# Patient Record
Sex: Male | Born: 1990 | Hispanic: Yes | Marital: Single | Smoking: Never smoker
Health system: Southern US, Community
[De-identification: ages and names within clinical notes are randomized; demographics above are authoritative.]

---

## 2019-10-08 ENCOUNTER — Other Ambulatory Visit: Payer: Self-pay | Admitting: *Deleted

## 2019-10-08 DIAGNOSIS — Z20822 Contact with and (suspected) exposure to covid-19: Secondary | ICD-10-CM

## 2019-10-10 LAB — NOVEL CORONAVIRUS, NAA: SARS-CoV-2, NAA: DETECTED — AB

## 2019-10-11 ENCOUNTER — Ambulatory Visit (INDEPENDENT_AMBULATORY_CARE_PROVIDER_SITE_OTHER)
Admission: RE | Admit: 2019-10-11 | Discharge: 2019-10-11 | Disposition: A | Discharge status: Home / Self Care | Payer: HRSA Program | Source: Ambulatory Visit

## 2019-10-11 ENCOUNTER — Telehealth: Payer: Self-pay

## 2019-10-11 DIAGNOSIS — R05 Cough: Secondary | ICD-10-CM | POA: Diagnosis not present

## 2019-10-11 DIAGNOSIS — U071 COVID-19: Secondary | ICD-10-CM

## 2019-10-11 MED ORDER — BENZONATATE 100 MG PO CAPS: 100.0000 mg | ORAL_CAPSULE | Freq: Three times a day (TID) | ORAL | 0 refills | Status: DC

## 2019-10-11 NOTE — Discharge Instructions (Signed)
Push fluids to ensure adequate hydration and keep secretions thin.  Aleve 500mg  twice a day, tylenol 1000mg  every 8 hours.  Please check to see if in your day/night syrup there is no additional acetaminophen- this is tylenol- so you are not taking too much tylenol.  Rest.  You may try Tessalon, which I have sent to the pharmacy, to try to help with the cough.  Over the counter Mucinex-D may also be helpful.  This likely will improve in the next week.  If you develop shortness of breath or difficulty breathing please be seen in person.

## 2019-10-11 NOTE — ED Provider Notes (Signed)
Virtual Visit via Video Note:  Albert Hammond  initiated request for Telemedicine visit with [facility] team. I connected with Albert Hammond  on 10/11/2019 at 5:40 PM  for a synchronized telemedicine visit using a video enabled HIPPA compliant telemedicine application. I verified that I am speaking with Albert Hammond  using two identifiers. Albert Gottron, NP  was physically located in a [facility]gent care site and Jacobo Breeze Angell was located at a different location.   The limitations of evaluation and management by telemedicine as well as the availability of in-person appointments were discussed. Patient was informed that he  may incur a bill ( including co-pay) for this virtual visit encounter. Albert Hammond  expressed understanding and gave verbal consent to proceed with virtual visit.     History of Present Illness:Albert Hammond  is a 28 y.o. male presents with complaints of fever, chills, cough, headache. Symptoms started approximately 6 days ago. Tested positive for covid-19 on 12/9. Discomfort with deep breathing, which triggers cough. Otherwise no shortness of breath. Has been taking aleve, tylenol, day/nigh cold/flu medications which haven't helped. Hasn't taken tylenol in two days ago. Took aleve last this morning. Requesting recommendations for symptom management for cough and headache related to covid-19.   History reviewed. No pertinent past medical history.  Not on File      Observations/Objective: Alert, oriented, non toxic in appearance. Clear coherent speech without difficulty. No increased work of breathing visualized.  Occasional dry cough noted. Patient in mask.   Assessment and Plan: Known covid-19 infection. Supportive cares recommended. Medication regimens discussed. Tessalon provided. In person precautions discussed. Patient verbalized understanding and agreeable to plan.    Follow Up  Instructions:    I discussed the assessment and treatment plan with the patient. The patient was provided an opportunity to ask questions and all were answered. The patient agreed with the plan and demonstrated an understanding of the instructions.   The patient was advised to call back or seek an in-person evaluation if the symptoms worsen or if the condition fails to improve as anticipated.  I provided 20 minutes of non-face-to-face time during this encounter.    Albert Gottron, NP  10/11/2019 5:40 PM          Albert Gottron, NP 10/11/19 1740

## 2019-10-13 ENCOUNTER — Inpatient Hospital Stay (HOSPITAL_COMMUNITY)
Admission: AD | Admit: 2019-10-13 | Discharge: 2019-10-22 | DRG: 177 | Disposition: A | Discharge status: Home / Self Care | Payer: HRSA Program | Source: Other Acute Inpatient Hospital | Attending: Internal Medicine | Admitting: Internal Medicine

## 2019-10-13 ENCOUNTER — Inpatient Hospital Stay: Payer: HRSA Program

## 2019-10-13 ENCOUNTER — Emergency Department: Payer: HRSA Program

## 2019-10-13 ENCOUNTER — Other Ambulatory Visit: Payer: Self-pay

## 2019-10-13 ENCOUNTER — Ambulatory Visit
Admission: EM | Admit: 2019-10-13 | Discharge: 2019-10-13 | Disposition: A | Discharge status: Home / Self Care | Payer: HRSA Program

## 2019-10-13 ENCOUNTER — Encounter: Payer: Self-pay | Admitting: Emergency Medicine

## 2019-10-13 ENCOUNTER — Ambulatory Visit (INDEPENDENT_AMBULATORY_CARE_PROVIDER_SITE_OTHER)
Admission: RE | Admit: 2019-10-13 | Discharge: 2019-10-13 | Disposition: A | Discharge status: Home / Self Care | Payer: Self-pay | Source: Ambulatory Visit

## 2019-10-13 ENCOUNTER — Inpatient Hospital Stay
Admission: EM | Admit: 2019-10-13 | Discharge: 2019-10-13 | DRG: 177 | Disposition: A | Discharge status: Other Acute Inpatient Hospital | Payer: HRSA Program | Source: Ambulatory Visit | Attending: Internal Medicine | Admitting: Internal Medicine

## 2019-10-13 DIAGNOSIS — E872 Acidosis: Secondary | ICD-10-CM | POA: Diagnosis present

## 2019-10-13 DIAGNOSIS — Z833 Family history of diabetes mellitus: Secondary | ICD-10-CM | POA: Diagnosis not present

## 2019-10-13 DIAGNOSIS — J1282 Pneumonia due to coronavirus disease 2019: Secondary | ICD-10-CM | POA: Diagnosis present

## 2019-10-13 DIAGNOSIS — J9601 Acute respiratory failure with hypoxia: Secondary | ICD-10-CM | POA: Diagnosis present

## 2019-10-13 DIAGNOSIS — I82442 Acute embolism and thrombosis of left tibial vein: Secondary | ICD-10-CM | POA: Diagnosis present

## 2019-10-13 DIAGNOSIS — U071 COVID-19: Secondary | ICD-10-CM

## 2019-10-13 DIAGNOSIS — Z6841 Body Mass Index (BMI) 40.0 and over, adult: Secondary | ICD-10-CM | POA: Diagnosis not present

## 2019-10-13 DIAGNOSIS — Z79899 Other long term (current) drug therapy: Secondary | ICD-10-CM

## 2019-10-13 DIAGNOSIS — I82452 Acute embolism and thrombosis of left peroneal vein: Secondary | ICD-10-CM | POA: Diagnosis present

## 2019-10-13 DIAGNOSIS — R7989 Other specified abnormal findings of blood chemistry: Secondary | ICD-10-CM | POA: Diagnosis not present

## 2019-10-13 DIAGNOSIS — R7401 Elevation of levels of liver transaminase levels: Secondary | ICD-10-CM | POA: Diagnosis present

## 2019-10-13 DIAGNOSIS — R0902 Hypoxemia: Secondary | ICD-10-CM | POA: Diagnosis not present

## 2019-10-13 DIAGNOSIS — R079 Chest pain, unspecified: Secondary | ICD-10-CM

## 2019-10-13 DIAGNOSIS — E1165 Type 2 diabetes mellitus with hyperglycemia: Secondary | ICD-10-CM | POA: Diagnosis present

## 2019-10-13 DIAGNOSIS — J1289 Other viral pneumonia: Secondary | ICD-10-CM | POA: Diagnosis present

## 2019-10-13 DIAGNOSIS — R0603 Acute respiratory distress: Secondary | ICD-10-CM

## 2019-10-13 DIAGNOSIS — R739 Hyperglycemia, unspecified: Secondary | ICD-10-CM | POA: Diagnosis present

## 2019-10-13 DIAGNOSIS — R0602 Shortness of breath: Secondary | ICD-10-CM

## 2019-10-13 LAB — COMPREHENSIVE METABOLIC PANEL
ALT: 31 U/L (ref 0–44)
AST: 51 U/L — ABNORMAL HIGH (ref 15–41)
Albumin: 3.1 g/dL — ABNORMAL LOW (ref 3.5–5.0)
Alkaline Phosphatase: 111 U/L (ref 38–126)
Anion gap: 19 — ABNORMAL HIGH (ref 5–15)
BUN: 13 mg/dL (ref 6–20)
CO2: 20 mmol/L — ABNORMAL LOW (ref 22–32)
Calcium: 8.9 mg/dL (ref 8.9–10.3)
Chloride: 97 mmol/L — ABNORMAL LOW (ref 98–111)
Creatinine, Ser: 0.68 mg/dL (ref 0.61–1.24)
GFR calc Af Amer: >60 mL/min (ref >60)
GFR calc non Af Amer: >60 mL/min (ref >60)
Glucose, Bld: 346 mg/dL — ABNORMAL HIGH (ref 70–99)
Potassium: 4.1 mmol/L (ref 3.5–5.1)
Sodium: 136 mmol/L (ref 135–145)
Total Bilirubin: 1 mg/dL (ref 0.3–1.2)
Total Protein: 7.8 g/dL (ref 6.5–8.1)

## 2019-10-13 LAB — CBC WITH DIFFERENTIAL/PLATELET
Abs Immature Granulocytes: 0.06 10*3/uL (ref 0.00–0.07)
Basophils Absolute: 0 10*3/uL (ref 0.0–0.1)
Basophils Relative: 0 %
Eosinophils Absolute: 0 10*3/uL (ref 0.0–0.5)
Eosinophils Relative: 0 %
HCT: 41.7 % (ref 39.0–52.0)
Hemoglobin: 14.4 g/dL (ref 13.0–17.0)
Immature Granulocytes: 1 %
Lymphocytes Relative: 21 %
Lymphs Abs: 1.7 10*3/uL (ref 0.7–4.0)
MCH: 28.1 pg (ref 26.0–34.0)
MCHC: 34.5 g/dL (ref 30.0–36.0)
MCV: 81.3 fL (ref 80.0–100.0)
Monocytes Absolute: 0.3 10*3/uL (ref 0.1–1.0)
Monocytes Relative: 4 %
Neutro Abs: 5.9 10*3/uL (ref 1.7–7.7)
Neutrophils Relative %: 74 %
Platelets: 225 10*3/uL (ref 150–400)
RBC: 5.13 MIL/uL (ref 4.22–5.81)
RDW: 12.1 % (ref 11.5–15.5)
WBC: 7.9 10*3/uL (ref 4.0–10.5)
nRBC: 0 % (ref 0.0–0.2)

## 2019-10-13 LAB — ABO/RH
ABO/RH(D): O POS
ABO/RH(D): O POS

## 2019-10-13 LAB — BRAIN NATRIURETIC PEPTIDE: B Natriuretic Peptide: 43 pg/mL (ref 0.0–100.0)

## 2019-10-13 LAB — BASIC METABOLIC PANEL
Anion gap: 15 (ref 5–15)
BUN: 15 mg/dL (ref 6–20)
CO2: 20 mmol/L — ABNORMAL LOW (ref 22–32)
Calcium: 8.1 mg/dL — ABNORMAL LOW (ref 8.9–10.3)
Chloride: 102 mmol/L (ref 98–111)
Creatinine, Ser: 0.73 mg/dL (ref 0.61–1.24)
GFR calc Af Amer: >60 mL/min (ref >60)
GFR calc non Af Amer: >60 mL/min (ref >60)
Glucose, Bld: 339 mg/dL — ABNORMAL HIGH (ref 70–99)
Potassium: 4.5 mmol/L (ref 3.5–5.1)
Sodium: 137 mmol/L (ref 135–145)

## 2019-10-13 LAB — FIBRIN DERIVATIVES D-DIMER (ARMC ONLY): Fibrin derivatives D-dimer (ARMC): 2572.14 ng/mL (FEU) — ABNORMAL HIGH (ref 0.00–499.00)

## 2019-10-13 LAB — TROPONIN I (HIGH SENSITIVITY)
Troponin I (High Sensitivity): 8 ng/L (ref <18)
Troponin I (High Sensitivity): 5 ng/L (ref <18)

## 2019-10-13 LAB — LACTIC ACID, PLASMA
Lactic Acid, Venous: 1.3 mmol/L (ref 0.5–1.9)
Lactic Acid, Venous: 1.7 mmol/L (ref 0.5–1.9)

## 2019-10-13 LAB — TRIGLYCERIDES: Triglycerides: 221 mg/dL — ABNORMAL HIGH (ref <150)

## 2019-10-13 LAB — FERRITIN: Ferritin: 899 ng/mL — ABNORMAL HIGH (ref 24–336)

## 2019-10-13 LAB — LACTATE DEHYDROGENASE: LDH: 473 U/L — ABNORMAL HIGH (ref 98–192)

## 2019-10-13 LAB — FIBRINOGEN: Fibrinogen: >750 mg/dL — ABNORMAL HIGH (ref 210–475)

## 2019-10-13 LAB — PROCALCITONIN: Procalcitonin: 0.15 ng/mL

## 2019-10-13 LAB — GLUCOSE, CAPILLARY
Glucose-Capillary: 308 mg/dL — ABNORMAL HIGH (ref 70–99)
Glucose-Capillary: 267 mg/dL — ABNORMAL HIGH (ref 70–99)
Glucose-Capillary: 308 mg/dL — ABNORMAL HIGH (ref 70–99)

## 2019-10-13 LAB — C-REACTIVE PROTEIN: CRP: 47.5 mg/dL — ABNORMAL HIGH (ref <1.0)

## 2019-10-13 MED ORDER — SODIUM CHLORIDE 0.9 % IV SOLN
100.0000 mg | Freq: Every day | INTRAVENOUS | Status: DC
  Filled 2019-10-13: qty 20

## 2019-10-13 MED ORDER — DEXAMETHASONE 6 MG PO TABS
6.0000 mg | ORAL_TABLET | ORAL | Status: DC
  Administered 2019-10-13: 6 mg via ORAL
  Filled 2019-10-13: qty 1

## 2019-10-13 MED ORDER — SODIUM CHLORIDE 0.9% FLUSH
3.0000 mL | Freq: Two times a day (BID) | INTRAVENOUS | Status: DC
  Administered 2019-10-14 – 2019-10-15 (×3): 3 mL via INTRAVENOUS

## 2019-10-13 MED ORDER — DEXAMETHASONE SODIUM PHOSPHATE 10 MG/ML IJ SOLN
6.0000 mg | Freq: Once | INTRAMUSCULAR | Status: AC
  Administered 2019-10-13: 6 mg via INTRAVENOUS
  Filled 2019-10-13: qty 1

## 2019-10-13 MED ORDER — IOHEXOL 350 MG/ML SOLN
75.0000 mL | Freq: Once | INTRAVENOUS | Status: AC | PRN
  Administered 2019-10-13: 75 mL via INTRAVENOUS

## 2019-10-13 MED ORDER — VITAMIN D3 25 MCG (1000 UNIT) PO TABS: 1000.0000 [IU] | ORAL_TABLET | Freq: Every day | ORAL | Status: AC

## 2019-10-13 MED ORDER — ENOXAPARIN SODIUM 40 MG/0.4ML ~~LOC~~ SOLN: 40.0000 mg | SUBCUTANEOUS | Status: DC

## 2019-10-13 MED ORDER — IPRATROPIUM-ALBUTEROL 20-100 MCG/ACT IN AERS
1.0000 | INHALATION_SPRAY | Freq: Four times a day (QID) | RESPIRATORY_TRACT | Status: DC
  Filled 2019-10-13: qty 4

## 2019-10-13 MED ORDER — SODIUM CHLORIDE 0.9 % IV SOLN
200.0000 mg | Freq: Once | INTRAVENOUS | Status: DC
  Filled 2019-10-13: qty 40

## 2019-10-13 MED ORDER — ASCORBIC ACID 500 MG PO TABS
500.0000 mg | ORAL_TABLET | Freq: Every day | ORAL | Status: DC
  Administered 2019-10-14 – 2019-10-22 (×9): 500 mg via ORAL
  Filled 2019-10-13 (×9): qty 1

## 2019-10-13 MED ORDER — DEXAMETHASONE SODIUM PHOSPHATE 10 MG/ML IJ SOLN: 6.0000 mg | INTRAMUSCULAR | 0 refills | Status: DC

## 2019-10-13 MED ORDER — ONDANSETRON HCL 4 MG PO TABS: 4.0000 mg | ORAL_TABLET | Freq: Four times a day (QID) | ORAL | 0 refills | Status: AC | PRN

## 2019-10-13 MED ORDER — SODIUM CHLORIDE 0.9 % IV SOLN
INTRAVENOUS | Status: DC
  Administered 2019-10-13: 18:00:00 via INTRAVENOUS

## 2019-10-13 MED ORDER — INSULIN ASPART 100 UNIT/ML ~~LOC~~ SOLN: 0.0000 [IU] | SUBCUTANEOUS | 11 refills | Status: DC

## 2019-10-13 MED ORDER — FAMOTIDINE 20 MG PO TABS: 20.0000 mg | ORAL_TABLET | Freq: Two times a day (BID) | ORAL | Status: DC

## 2019-10-13 MED ORDER — SODIUM CHLORIDE 0.9 % IV SOLN
100.0000 mg | Freq: Every day | INTRAVENOUS | Status: AC
  Administered 2019-10-14 – 2019-10-17 (×4): 100 mg via INTRAVENOUS
  Filled 2019-10-13 (×4): qty 20

## 2019-10-13 MED ORDER — ONDANSETRON HCL 4 MG PO TABS: 4.0000 mg | ORAL_TABLET | Freq: Four times a day (QID) | ORAL | Status: DC | PRN

## 2019-10-13 MED ORDER — ASCORBIC ACID 500 MG PO TABS
500.0000 mg | ORAL_TABLET | Freq: Every day | ORAL | Status: DC
  Filled 2019-10-13 (×2): qty 1

## 2019-10-13 MED ORDER — SODIUM CHLORIDE 0.9 % IV SOLN: 200.0000 mg | Freq: Once | INTRAVENOUS | Status: DC

## 2019-10-13 MED ORDER — ASCORBIC ACID 500 MG PO TABS: 500.0000 mg | ORAL_TABLET | Freq: Every day | ORAL | Status: AC

## 2019-10-13 MED ORDER — SODIUM CHLORIDE 0.9 % IV SOLN
200.0000 mg | Freq: Once | INTRAVENOUS | Status: AC
  Administered 2019-10-13: 200 mg via INTRAVENOUS
  Filled 2019-10-13: qty 200

## 2019-10-13 MED ORDER — INSULIN ASPART 100 UNIT/ML ~~LOC~~ SOLN: 0.0000 [IU] | SUBCUTANEOUS | Status: DC

## 2019-10-13 MED ORDER — ONDANSETRON HCL 4 MG/2ML IJ SOLN: 4.0000 mg | Freq: Four times a day (QID) | INTRAMUSCULAR | Status: DC | PRN

## 2019-10-13 MED ORDER — SODIUM CHLORIDE 0.9 % IV SOLN: 250.0000 mL | INTRAVENOUS | Status: DC | PRN

## 2019-10-13 MED ORDER — VITAMIN D3 25 MCG (1000 UNIT) PO TABS
1000.0000 [IU] | ORAL_TABLET | Freq: Every day | ORAL | Status: DC
  Filled 2019-10-13: qty 1

## 2019-10-13 MED ORDER — INSULIN ASPART 100 UNIT/ML ~~LOC~~ SOLN
0.0000 [IU] | Freq: Three times a day (TID) | SUBCUTANEOUS | Status: DC
  Administered 2019-10-14: 6 [IU] via SUBCUTANEOUS
  Administered 2019-10-14: 4 [IU] via SUBCUTANEOUS
  Administered 2019-10-14: 09:00:00 3 [IU] via SUBCUTANEOUS
  Administered 2019-10-15: 5 [IU] via SUBCUTANEOUS
  Administered 2019-10-15: 12:00:00 6 [IU] via SUBCUTANEOUS

## 2019-10-13 MED ORDER — ONDANSETRON HCL 4 MG/2ML IJ SOLN: 4.0000 mg | Freq: Four times a day (QID) | INTRAMUSCULAR | 0 refills | Status: DC | PRN

## 2019-10-13 MED ORDER — DEXAMETHASONE SODIUM PHOSPHATE 10 MG/ML IJ SOLN: 6.0000 mg | INTRAMUSCULAR | Status: DC

## 2019-10-13 MED ORDER — SODIUM CHLORIDE 0.9 % IV SOLN: 100.0000 mg | Freq: Every day | INTRAVENOUS | Status: DC

## 2019-10-13 MED ORDER — ENOXAPARIN SODIUM 60 MG/0.6ML ~~LOC~~ SOLN
0.5000 mg/kg | Freq: Two times a day (BID) | SUBCUTANEOUS | Status: DC
  Administered 2019-10-13 – 2019-10-17 (×9): 45 mg via SUBCUTANEOUS
  Filled 2019-10-13 (×9): qty 0.6

## 2019-10-13 MED ORDER — ZINC SULFATE 220 (50 ZN) MG PO CAPS: 220.0000 mg | ORAL_CAPSULE | Freq: Every day | ORAL | Status: AC

## 2019-10-13 MED ORDER — SODIUM CHLORIDE 0.9 % IV BOLUS
1000.0000 mL | Freq: Once | INTRAVENOUS | Status: AC
  Administered 2019-10-13: 1000 mL via INTRAVENOUS

## 2019-10-13 MED ORDER — INSULIN GLARGINE 100 UNIT/ML ~~LOC~~ SOLN
10.0000 [IU] | Freq: Every day | SUBCUTANEOUS | Status: DC
  Administered 2019-10-13: 10 [IU] via SUBCUTANEOUS
  Filled 2019-10-13 (×2): qty 0.1

## 2019-10-13 MED ORDER — IPRATROPIUM-ALBUTEROL 20-100 MCG/ACT IN AERS: 1.0000 | INHALATION_SPRAY | Freq: Four times a day (QID) | RESPIRATORY_TRACT | Status: DC

## 2019-10-13 MED ORDER — SODIUM CHLORIDE 0.9% FLUSH: 3.0000 mL | INTRAVENOUS | Status: DC | PRN

## 2019-10-13 MED ORDER — SODIUM CHLORIDE 0.9 % IV SOLN: 2000.0000 mL | INTRAVENOUS | 0 refills | Status: DC

## 2019-10-13 MED ORDER — ZINC SULFATE 220 (50 ZN) MG PO CAPS
220.0000 mg | ORAL_CAPSULE | Freq: Every day | ORAL | Status: DC
  Filled 2019-10-13: qty 1

## 2019-10-13 MED ORDER — ACETAMINOPHEN 325 MG PO TABS: 650.0000 mg | ORAL_TABLET | Freq: Four times a day (QID) | ORAL | Status: DC | PRN

## 2019-10-13 MED ORDER — ZINC SULFATE 220 (50 ZN) MG PO CAPS
220.0000 mg | ORAL_CAPSULE | Freq: Every day | ORAL | Status: DC
  Administered 2019-10-14 – 2019-10-22 (×9): 220 mg via ORAL
  Filled 2019-10-13 (×9): qty 1

## 2019-10-13 MED ORDER — INSULIN ASPART 100 UNIT/ML ~~LOC~~ SOLN
15.0000 [IU] | Freq: Once | SUBCUTANEOUS | Status: AC
  Administered 2019-10-13: 15 [IU] via SUBCUTANEOUS
  Filled 2019-10-13: qty 1

## 2019-10-13 MED ORDER — ACETAMINOPHEN 325 MG PO TABS
650.0000 mg | ORAL_TABLET | Freq: Four times a day (QID) | ORAL | Status: DC | PRN
  Administered 2019-10-18: 650 mg via ORAL
  Filled 2019-10-13 (×2): qty 2

## 2019-10-13 NOTE — ED Triage Notes (Signed)
Patient from UC via [facility]. Reports worsening SOB and cough since Wednesday. Reports this morning he felt like he couldn't catch his breath at all.   Patient and family members tested positive for covid 5 days ago.  Patient 60% on RA for UC. O2 increased to 85% when placed on non-rebreather at [facility]. Upon arrival to ED, patient is on 4L [place] with Ow2 saturations of 73%. Non-rebreather placed on patient with 15L, O2 improved to 88%.

## 2019-10-13 NOTE — ED Provider Notes (Signed)
[facility] Emergency Department Provider Note  ____________________________________________   First MD Initiated Contact with Patient 10/13/19 1458     (approximate)  I have reviewed the triage vital signs and the nursing notes.   HISTORY  Chief Complaint Shortness of Breath    HPI Albert Hammond is a 28 y.o. male with known coronavirus she comes in with shortness of breath.  Patient has had symptoms for 1 week.  He was positive for coronavirus 5 days ago.  Patient satting 57 to 64% on room air at urgent care.  Patient states his symptoms for started 1 week ago however he was tested positive on Wednesday.  He states over the past 3 days has had worsening shortness of breath is severe, constant, worse with exertion, not take anything at home.  He is also had some fevers and body aches as well.  Denies any history of blood clots.  Otherwise healthy.  No unilateral leg swelling.  No other risk factors for pulmonary embolism          History reviewed. No pertinent past medical history.  There are no problems to display for this patient.   History reviewed. No pertinent surgical history.  Prior to Admission medications   Medication Sig Start Date End Date Taking? Authorizing Provider  benzonatate (TESSALON) 100 MG capsule Take 1-2 capsules (100-200 mg total) by mouth every 8 (eight) hours. 10/11/19   Georgetta Haber, NP    Allergies Patient has no known allergies.  No family history on file.  Social History Social History   Tobacco Use  . Smoking status: Never Smoker  . Smokeless tobacco: Never Used  Substance Use Topics  . Alcohol use: Yes    Comment: occasional  . Drug use: Never      Review of Systems Constitutional: Positive fevers, body aches Eyes: No visual changes. ENT: No sore throat. Cardiovascular: No chest pain Respiratory: Positive for SOB, cough Gastrointestinal: No abdominal pain.  No nausea, no vomiting.  No  diarrhea.  No constipation. Genitourinary: Negative for dysuria. Musculoskeletal: Negative for back pain. Skin: Negative for rash. Neurological: Positive headache, no focal weakness or numbness. All other ROS negative ____________________________________________   PHYSICAL EXAM:  VITAL SIGNS: ED Triage Vitals  Enc Vitals Group     BP 10/13/19 1432 115/63     Pulse Rate 10/13/19 1432 (!) 117     Resp 10/13/19 1432 (!) 28     Temp 10/13/19 1432 98.4 F (36.9 C)     Temp Source 10/13/19 1432 Oral     SpO2 10/13/19 1432 (!) 88 %     Weight 10/13/19 1433 200 lb (90.7 kg)     Height 10/13/19 1433 5\' 7"  (1.702 m)     Head Circumference --      Peak Flow --      Pain Score 10/13/19 1433 0     Pain Loc --      Pain Edu? --      Excl. in GC? --     Constitutional: Alert and oriented. Well appearing and in no acute distress. Eyes: Conjunctivae are normal. EOMI. Head: Atraumatic. Nose: No congestion/rhinnorhea. Mouth/Throat: Mucous membranes are moist.   Neck: No stridor. Trachea Midline. FROM Cardiovascular: Normal rate, regular rhythm. Grossly normal heart sounds.  Good peripheral circulation. Respiratory: Mild increased work of breathing, on nonrebreathing, no stridor, speaking in full sentences Gastrointestinal: Soft and nontender. No distention. No abdominal bruits.  Musculoskeletal: No lower extremity tenderness nor  edema.  No joint effusions. Neurologic:  Normal speech and language. No gross focal neurologic deficits are appreciated.  Skin:  Skin is warm, dry and intact. No rash noted. Psychiatric: Mood and affect are normal. Speech and behavior are normal. GU: Deferred   ____________________________________________   LABS (all labs ordered are listed, but only abnormal results are displayed)  Labs Reviewed  CULTURE, BLOOD (ROUTINE X 2)  CULTURE, BLOOD (ROUTINE X 2)  LACTIC ACID, PLASMA  LACTIC ACID, PLASMA  CBC WITH DIFFERENTIAL/PLATELET  COMPREHENSIVE METABOLIC  PANEL  FIBRIN DERIVATIVES D-DIMER ([facility] ONLY)  PROCALCITONIN  LACTATE DEHYDROGENASE  FERRITIN  TRIGLYCERIDES  FIBRINOGEN  C-REACTIVE PROTEIN  BRAIN NATRIURETIC PEPTIDE  TROPONIN I (HIGH SENSITIVITY)   ____________________________________________   ED ECG REPORT I, Vanessa St. Florian, the attending physician, personally viewed and interpreted this ECG.  EKG sinus tachycardia rate of 112, no ST elevation, T wave inversion in lead III, normal intervals ____________________________________________  RADIOLOGY Robert Bellow, personally viewed and evaluated these images (plain radiographs) as part of my medical decision making, as well as reviewing the written report by the radiologist.  ED MD interpretation: Bilateral opacities consistent with Covid  Official radiology report(s): CT ANGIO CHEST PE W OR WO CONTRAST  Result Date: 10/13/2019 CLINICAL DATA:  Covid +, hypoxia^66mL OMNIPAQUE IOHEXOL 350 MG/ML SOLNHypoxemia EXAM: CT ANGIOGRAPHY CHEST WITH CONTRAST TECHNIQUE: Multidetector CT imaging of the chest was performed using the standard protocol during bolus administration of intravenous contrast. Multiplanar CT image reconstructions and MIPs were obtained to evaluate the vascular anatomy. CONTRAST:  62mL OMNIPAQUE IOHEXOL 350 MG/ML SOLN COMPARISON:  Radiograph same day FINDINGS: Cardiovascular: No filling defects within the pulmonary arteries to suggest acute pulmonary embolism. No acute findings of the aorta or great vessels. No pericardial fluid. Mediastinum/Nodes: No axillary or supraclavicular adenopathy. No mediastinal hilar adenopathy. No pericardial fluid Lungs/Pleura: multinodular airspace disease the upper lobes surrounded by ground-glass opacities. Dense consolidation in the RIGHT lower lobe. Medial consolidation LEFT lower lobe. Upper Abdomen: Limited view of the liver, kidneys, pancreas are unremarkable. Normal adrenal glands. Musculoskeletal: No aggressive osseous lesion. Review of  the MIP images confirms the above findings. IMPRESSION: 1. No evidence acute pulmonary embolism. 2. Severe bilateral multifocal pneumonia. Upper lobe nodular airspace disease with peripheral gland glass opacities. Dense consolidation in the lower lobes, greater on the RIGHT. Electronically Signed   By: Suzy Bouchard M.D.   On: 10/13/2019 17:19   DG Chest Port 1 View  Result Date: 10/13/2019 CLINICAL DATA:  Shortness of breath, cough EXAM: PORTABLE CHEST 1 VIEW COMPARISON:  None. FINDINGS: Patchy bilateral airspace opacities. Heart is normal size. Low lung volumes. No effusions or pneumothorax. No acute bony abnormality. IMPRESSION: Patchy bilateral airspace disease compatible with multifocal pneumonia. Electronically Signed   By: Rolm Baptise M.D.   On: 10/13/2019 15:19    ____________________________________________   PROCEDURES  Procedure(s) performed (including Critical Care):  .Critical Care Performed by: Vanessa Tyler, MD Authorized by: Vanessa Wildomar, MD   Critical care provider statement:    Critical care time (minutes):  35   Critical care was necessary to treat or prevent imminent or life-threatening deterioration of the following conditions:  Respiratory failure   Critical care was time spent personally by me on the following activities:  Discussions with consultants, evaluation of patient's response to treatment, examination of patient, ordering and performing treatments and interventions, ordering and review of laboratory studies, ordering and review of radiographic studies, pulse oximetry, re-evaluation of patient's condition,  obtaining history from patient or surrogate and review of old charts     ____________________________________________   INITIAL IMPRESSION / ASSESSMENT AND PLAN / ED COURSE   Jodi Larrie KassRodriguez Umansor was evaluated in Emergency Department on 10/13/2019 for the symptoms described in the history of present illness. He was evaluated in the context of  the global COVID-19 pandemic, which necessitated consideration that the patient might be at risk for infection with the SARS-CoV-2 virus that causes COVID-19. Institutional protocols and algorithms that pertain to the evaluation of patients at risk for COVID-19 are in a state of rapid change based on information released by regulatory bodies including the CDC and federal and state organizations. These policies and algorithms were followed during the patient's care in the ED.     Pt presents with SOB.  This is most likely secondary to coronavirus PNA-will get xray to evaluation Anemia-CBC to evaluate ACS- will get trops Arrhythmia-Will get EKG and keep on monitor.  COVID- will get testing per algorithm. PE-lower suspicion given no risk factors and other cause more likely from coronavirus.  Called respiratory to place patient on high flow nasal cannula given he satting 88% on nonrebreather.   Count is normal no fever will hold off antibiotics at this time.  Procalcitonin less than 0.25 as well.  D/w hospital team for admission for Covid          Clinical Course as of Oct 12 1742  Mon Oct 13, 2019  1603 DG Chest CrawfordPort 1 View [MF]    Clinical Course User Index [MF] Concha SeFunke, Breuna Loveall E, MD     ____________________________________________   FINAL CLINICAL IMPRESSION(S) / ED DIAGNOSES   Final diagnoses:  COVID-19  Acute respiratory failure with hypoxia (HCC)     MEDICATIONS GIVEN DURING THIS VISIT:  Medications  enoxaparin (LOVENOX) injection 40 mg (has no administration in time range)  0.9 %  sodium chloride infusion ( Intravenous New Bag/Given 10/13/19 1743)  dexamethasone (DECADRON) injection 6 mg (has no administration in time range)  famotidine (PEPCID) tablet 20 mg (has no administration in time range)  acetaminophen (TYLENOL) tablet 650 mg (has no administration in time range)  ascorbic acid (VITAMIN C) tablet 500 mg (has no administration in time range)  zinc sulfate  capsule 220 mg (has no administration in time range)  remdesivir 200 mg in sodium chloride 0.9% 250 mL IVPB (has no administration in time range)    Followed by  remdesivir 100 mg in sodium chloride 0.9 % 100 mL IVPB (has no administration in time range)  ondansetron (ZOFRAN) tablet 4 mg (has no administration in time range)    Or  ondansetron (ZOFRAN) injection 4 mg (has no administration in time range)  insulin aspart (novoLOG) injection 0-20 Units (has no administration in time range)  dexamethasone (DECADRON) injection 6 mg (6 mg Intravenous Given 10/13/19 1548)  iohexol (OMNIPAQUE) 350 MG/ML injection 75 mL (75 mLs Intravenous Contrast Given 10/13/19 1705)  insulin aspart (novoLOG) injection 15 Units (15 Units Subcutaneous Given 10/13/19 1738)  sodium chloride 0.9 % bolus 1,000 mL (1,000 mLs Intravenous New Bag/Given 10/13/19 1743)     ED Discharge Orders    None       Note:  This document was prepared using Dragon voice recognition software and may include unintentional dictation errors.   Concha SeFunke, Conchetta Lamia E, MD 10/13/19 [phone] [id number]

## 2019-10-13 NOTE — ED Notes (Signed)
Pt transferred to ER.

## 2019-10-13 NOTE — H&P (Signed)
History and Physical    Albert Hammond UXN:[id number] DOB: 1990/11/09 DOA: 10/13/2019  PCP: Patient, No Pcp Per  Patient coming from:  home  Chief Complaint:  sob  HPI: Albert Hammond is a 28 y.o. male with no previous past medical history comes emergency department with worsening shortness of breath and cough tested positive for Covid 5 days ago.  Been having fevers chills or shortness of breath for about 10 days.  Denies any nausea vomiting diarrhea.  He has no past medical history and is does not have any history of diabetes that he knows of.  Patient found to be hypoxic in the emergency department with oxygen sats in the 60s on room air.  He has been placed on high flow nasal cannula at 50 L and given remdesivir and Decadron and referred to admission to [facility] in [place].  Patient was transferred here for care of his bilateral Covid pneumonia.   Review of Systems: As per HPI otherwise 10 point review of systems negative.   No past medical history on file. none   No past surgical history on file.  none   reports that he has never smoked. He has never used smokeless tobacco. He reports current alcohol use. He reports that he does not use drugs.  No Known Allergies  No family history on file. no premature coronary artery disease  Prior to Admission medications   Medication Sig Start Date End Date Taking? Authorizing Provider  acetaminophen (TYLENOL) 325 MG tablet Take 650 mg by mouth every 6 (six) hours as needed.    [provider]  ascorbic acid (VITAMIN C) 500 MG tablet Take 1 tablet (500 mg total) by mouth daily. 10/13/19   Lorella Nimrod, MD  cholecalciferol (VITAMIN D) 25 MCG (1000 UT) tablet Take 1 tablet (1,000 Units total) by mouth daily. 10/13/19   Lorella Nimrod, MD  dexamethasone (DECADRON) 10 MG/ML injection Inject 0.6 mLs (6 mg total) into the vein daily. 10/14/19   Lorella Nimrod, MD  enoxaparin (LOVENOX) 40 MG/0.4ML injection Inject  0.4 mLs (40 mg total) into the skin daily. 10/13/19   Lorella Nimrod, MD  famotidine (PEPCID) 20 MG tablet Take 1 tablet (20 mg total) by mouth 2 (two) times daily. 10/13/19   Lorella Nimrod, MD  insulin aspart (NOVOLOG) 100 UNIT/ML injection Inject 0-20 Units into the skin every 4 (four) hours. 10/13/19   Lorella Nimrod, MD  Ipratropium-Albuterol (COMBIVENT) 20-100 MCG/ACT AERS respimat Inhale 1 puff into the lungs every 6 (six) hours. 10/13/19   Lorella Nimrod, MD  ondansetron (ZOFRAN) 4 MG tablet Take 1 tablet (4 mg total) by mouth every 6 (six) hours as needed for nausea. 10/13/19   Lorella Nimrod, MD  ondansetron (ZOFRAN) 4 MG/2ML SOLN injection Inject 2 mLs (4 mg total) into the vein every 6 (six) hours as needed for nausea. 10/13/19   Lorella Nimrod, MD  sodium chloride 0.9 % infusion Inject 2,000 mLs into the vein continuous for 1 day. 10/13/19 10/14/19  Lorella Nimrod, MD  zinc sulfate 220 (50 Zn) MG capsule Take 1 capsule (220 mg total) by mouth daily. 10/13/19   Lorella Nimrod, MD    Physical Exam: There were no vitals filed for this visit.  On high flow nasal cannula at this time  Afebrile vital signs stable  Constitutional: NAD, calm, comfortable There were no vitals filed for this visit. Eyes: PERRL, lids and conjunctivae normal ENMT: Mucous membranes are moist. Posterior pharynx clear of any exudate or lesions.Normal  dentition.  Neck: normal, supple, no masses, no thyromegaly Respiratory: clear to auscultation bilaterally, no wheezing, no crackles. Normal respiratory effort. No accessory muscle use.  Cardiovascular: Regular rate and rhythm, no murmurs / rubs / gallops. No extremity edema. 2+ pedal pulses. No carotid bruits.  Abdomen: no tenderness, no masses palpated. No hepatosplenomegaly. Bowel sounds positive.  Musculoskeletal: no clubbing / cyanosis. No joint deformity upper and lower extremities. Good ROM, no contractures. Normal muscle tone.  Skin: no rashes, lesions, ulcers. No  induration Neurologic: CN 2-12 grossly intact. Sensation intact, DTR normal. Strength 5/5 in all 4.  Psychiatric: Normal judgment and insight. Alert and oriented x 3. Normal mood.    Labs on Admission: I have personally reviewed following labs and imaging studies  CBC: Recent Labs  Lab 10/13/19 1459  WBC 7.9  NEUTROABS 5.9  HGB 14.4  HCT 41.7  MCV 81.3  PLT 225   Basic Metabolic Panel: Recent Labs  Lab 10/13/19 1459 10/13/19 2150  NA 136 137  K 4.1 4.5  CL 97* 102  CO2 20* 20*  GLUCOSE 346* 339*  BUN 13 15  CREATININE 0.68 0.73  CALCIUM 8.9 8.1*   GFR: Estimated Creatinine Clearance: 147.6 mL/min (by C-G formula based on SCr of 0.73 mg/dL). Liver Function Tests: Recent Labs  Lab 10/13/19 1459  AST 51*  ALT 31  ALKPHOS 111  BILITOT 1.0  PROT 7.8  ALBUMIN 3.1*   No results for input(s): LIPASE, AMYLASE in the last 168 hours. No results for input(s): AMMONIA in the last 168 hours. Coagulation Profile: No results for input(s): INR, PROTIME in the last 168 hours. Cardiac Enzymes: No results for input(s): CKTOTAL, CKMB, CKMBINDEX, TROPONINI in the last 168 hours. BNP (last 3 results) No results for input(s): PROBNP in the last 8760 hours. HbA1C: No results for input(s): HGBA1C in the last 72 hours. CBG: Recent Labs  Lab 10/13/19 1728 10/13/19 1856 10/13/19 2212  GLUCAP 308* 267* 308*   Lipid Profile: Recent Labs    10/13/19 1459  TRIG 221*   Thyroid Function Tests: No results for input(s): TSH, T4TOTAL, FREET4, T3FREE, THYROIDAB in the last 72 hours. Anemia Panel: Recent Labs    10/13/19 1459  FERRITIN 899*   Urine analysis: No results found for: COLORURINE, APPEARANCEUR, LABSPEC, PHURINE, GLUCOSEU, HGBUR, BILIRUBINUR, KETONESUR, PROTEINUR, UROBILINOGEN, NITRITE, LEUKOCYTESUR Sepsis Labs: !!!!!!!!!!!!!!!!!!!!!!!!!!!!!!!!!!!!!!!!!!!! @LABRCNTIP (procalcitonin:4,lacticidven:4) ) Recent Results (from the past 240 hour(s))  Novel Coronavirus,  NAA ([facility])     Status: Abnormal   Collection Time: 10/08/19 12:00 AM   Specimen: Oropharyngeal(OP) collection in vial transport medium   OROPHARYNGEA  TESTING  Result Value Ref Range Status   SARS-CoV-2, NAA Detected (A) Not Detected Final    Comment: This nucleic acid amplification test was developed and its performance characteristics determined by 14/09/20. Nucleic acid amplification tests include PCR and TMA. This test has not been FDA cleared or approved. This test has been authorized by FDA under an Emergency Use Authorization (EUA). This test is only authorized for the duration of time the declaration that circumstances exist justifying the authorization of the emergency use of in vitro diagnostic tests for detection of SARS-CoV-2 virus and/or diagnosis of COVID-19 infection under section 564(b)(1) of the Act, 21 U.S.C. [employer]) (1), unless the authorization is terminated or revoked sooner. When diagnostic testing is negative, the possibility of a false negative result should be considered in the context of a patient's recent exposures and the presence of clinical signs and symptoms consistent with COVID-19. An  individual without symptoms of COVID-19 and who is not shedding SARS-CoV-2 virus would  expect to have a negative (not detected) result in this assay.      Radiological Exams on Admission: CT ANGIO CHEST PE W OR WO CONTRAST  Result Date: 10/13/2019 CLINICAL DATA:  Covid +, hypoxia^4275mL OMNIPAQUE IOHEXOL 350 MG/ML SOLNHypoxemia EXAM: CT ANGIOGRAPHY CHEST WITH CONTRAST TECHNIQUE: Multidetector CT imaging of the chest was performed using the standard protocol during bolus administration of intravenous contrast. Multiplanar CT image reconstructions and MIPs were obtained to evaluate the vascular anatomy. CONTRAST:  75mL OMNIPAQUE IOHEXOL 350 MG/ML SOLN COMPARISON:  Radiograph same day FINDINGS: Cardiovascular: No filling defects within the pulmonary arteries  to suggest acute pulmonary embolism. No acute findings of the aorta or great vessels. No pericardial fluid. Mediastinum/Nodes: No axillary or supraclavicular adenopathy. No mediastinal hilar adenopathy. No pericardial fluid Lungs/Pleura: multinodular airspace disease the upper lobes surrounded by ground-glass opacities. Dense consolidation in the RIGHT lower lobe. Medial consolidation LEFT lower lobe. Upper Abdomen: Limited view of the liver, kidneys, pancreas are unremarkable. Normal adrenal glands. Musculoskeletal: No aggressive osseous lesion. Review of the MIP images confirms the above findings. IMPRESSION: 1. No evidence acute pulmonary embolism. 2. Severe bilateral multifocal pneumonia. Upper lobe nodular airspace disease with peripheral gland glass opacities. Dense consolidation in the lower lobes, greater on the RIGHT. Electronically Signed   By: Genevive BiStewart  Edmunds M.D.   On: 10/13/2019 17:19   DG Chest Port 1 View  Result Date: 10/13/2019 CLINICAL DATA:  Shortness of breath, cough EXAM: PORTABLE CHEST 1 VIEW COMPARISON:  None. FINDINGS: Patchy bilateral airspace opacities. Heart is normal size. Low lung volumes. No effusions or pneumothorax. No acute bony abnormality. IMPRESSION: Patchy bilateral airspace disease compatible with multifocal pneumonia. Electronically Signed   By: Charlett NoseKevin  Dover M.D.   On: 10/13/2019 15:19    Old chart reviewed Chest x-ray reviewed bilateral diffuse infiltrates  Assessment/Plan 28 year old male with acute approxirespiratory failure secondary to bilateral Covid pneumonia Principal Problem:   Pneumonia due to COVID-19 virus-patient requiring high amounts of supplemental oxygen.  Placed on high flow nasal cannula.  Also placed on remdesivir and Decadron.  Placed on 0.5 mg/kg of Lovenox every 12.  Also placed on vitamin C and zinc.  Monitor respiratory status closely.  Currently mentating normally.  Currently in no respiratory distress.  Active Problems:   Acute  respiratory failure with hypoxia (HCC)-as above    Hyperglycemia-hemoglobin A1c pending.  Start on Lantus and placed on sliding scale insulin.  Glucose over 300.  Likely has underlying diabetes.  Has a mild gap may be early DKA.  Checking a stat BMP again.  Urinalysis is pending.  Received 2 L of IV fluids in the emergency department will wait on repeat labs before providing any more fluids in the setting of Covid pneumonia.     DVT prophylaxis: Lovenox Code Status: Full Family Communication: None Disposition Plan: Probably weeks Consults called: None Admission status: Admission   Devesh Monforte A MD [facility]  If 7PM-7AM, please contact night-coverage [email or web address] Password [email or web address]  10/13/2019, 11:39 PM

## 2019-10-13 NOTE — ED Notes (Signed)
Pt placed on NRB, placed on 15L o2, sats came up to 90%. Pt is Drowsy. EMS called to take patient. To ER.

## 2019-10-13 NOTE — Consult Note (Signed)
Remdesivir - Pharmacy Brief Note   O:  ALT: 31DG Chest Port 1: Patchy bilateral airspace disease compatible with multifocal pneumonia. SpO2: 92% on HFNC 50 L/min   COVID + 12/9   A/P:  Remdesivir 200 mg IVPB once followed by 100 mg IVPB daily x 4 days.   Albert Hammond, PharmD Clinical Pharmacist   10/13/2019 5:03 PM

## 2019-10-13 NOTE — ED Notes (Signed)
[facility] at bedside to transfer patient. Patient placed back on 15L non-rebreather for transport.

## 2019-10-13 NOTE — ED Provider Notes (Signed)
Albert Hammond    CSN: [id number] Arrival date & time: 10/13/19  1318      History   Chief Complaint Chief Complaint  Patient presents with  . Shortness of Breath    COVID +    HPI Albert Hammond is a 28 y.o. male.   Patient presents with nonproductive cough and shortness of breath x1 week.  He had a video visit today and was told to come here for evaluation.  Patient states he tested positive for COVID 5 days ago.  No treatments attempted at home.  He denies fever, diarrhea, or other symptoms.  The history is provided by the patient.    History reviewed. No pertinent past medical history.  There are no problems to display for this patient.   History reviewed. No pertinent surgical history.     Home Medications    Prior to Admission medications   Medication Sig Start Date End Date Taking? Authorizing Provider  benzonatate (TESSALON) 100 MG capsule Take 1-2 capsules (100-200 mg total) by mouth every 8 (eight) hours. 10/11/19   Zigmund Gottron, NP    Family History History reviewed. No pertinent family history.  Social History Social History   Tobacco Use  . Smoking status: Never Smoker  . Smokeless tobacco: Never Used  Substance Use Topics  . Alcohol use: Not on file  . Drug use: Not on file     Allergies   Patient has no known allergies.   Review of Systems Review of Systems  Constitutional: Negative for chills and fever.  HENT: Negative for ear pain and sore throat.   Eyes: Negative for pain and visual disturbance.  Respiratory: Positive for cough and shortness of breath.   Cardiovascular: Negative for chest pain and palpitations.  Gastrointestinal: Negative for abdominal pain and vomiting.  Genitourinary: Negative for dysuria and hematuria.  Musculoskeletal: Negative for arthralgias and back pain.  Skin: Negative for color change and rash.  Neurological: Negative for seizures and syncope.  All other systems reviewed and are  negative.    Physical Exam Triage Vital Signs ED Triage Vitals [10/13/19 1324]  Enc Vitals Group     BP      Pulse      Resp      Temp      Temp src      SpO2      Weight      Height      Head Circumference      Peak Flow      Pain Score 0     Pain Loc      Pain Edu?      Excl. in [place]?    No data found.  Updated Vital Signs BP 114/77 (BP Location: Left Arm)   Pulse (!) 122   Temp 97.7 F (36.5 C) (Temporal)   Resp 20   SpO2 90%   Visual Acuity Right Eye Distance:   Left Eye Distance:   Bilateral Distance:    Right Eye Near:   Left Eye Near:    Bilateral Near:     Physical Exam Vitals and nursing note reviewed.  Constitutional:      General: He is in acute distress.     Appearance: He is well-developed. He is ill-appearing.  HENT:     Head: Normocephalic and atraumatic.     Mouth/Throat:     Mouth: Mucous membranes are moist.     Pharynx: Oropharynx is clear.  Eyes:  Conjunctiva/sclera: Conjunctivae normal.  Cardiovascular:     Rate and Rhythm: Regular rhythm. Tachycardia present.     Heart sounds: No murmur.  Pulmonary:     Effort: Respiratory distress present.     Breath sounds: No wheezing or rhonchi.     Comments: O2 sats 57-64% on RA; 85% on O2 Abdominal:     General: Bowel sounds are normal.     Palpations: Abdomen is soft.     Tenderness: There is no abdominal tenderness. There is no guarding or rebound.  Musculoskeletal:     Cervical back: Neck supple.  Skin:    General: Skin is warm and dry.     Findings: No rash.  Neurological:     General: No focal deficit present.     Mental Status: He is alert.     Sensory: No sensory deficit.     Motor: No weakness.  Psychiatric:        Mood and Affect: Mood normal.        Behavior: Behavior normal.      UC Treatments / Results  Labs (all labs ordered are listed, but only abnormal results are displayed) Labs Reviewed - No data to display  EKG   Radiology No results found.   Procedures Procedures (including critical care time)  Medications Ordered in UC Medications - No data to display  Initial Impression / Assessment and Plan / UC Course  I have reviewed the triage vital signs and the nursing notes.  Pertinent labs & imaging results that were available during my care of the patient were reviewed by me and considered in my medical decision making (see chart for details).   Hypoxia.  COVID-19.  O2 sats 57-64% on room air; 85% on O2.  Sending patient to ED via EMS.     Final Clinical Impressions(s) / UC Diagnoses   Final diagnoses:  Hypoxia  COVID-19     Discharge Instructions     Sending to ED via EMS.      ED Prescriptions    None     PDMP not reviewed this encounter.   Mickie Bail, NP 10/13/19 [phone]

## 2019-10-13 NOTE — ED Notes (Signed)
Unable to obtain transfer signature because patient unable to ambulate to computer due to hypoxemia. Verbalized understanding of transfer.

## 2019-10-13 NOTE — Discharge Instructions (Signed)
Sending to ED via EMS.   

## 2019-10-13 NOTE — H&P (Signed)
History and Physical    Albert Hammond HER:[id number] DOB: [birth year] DOA: 10/13/2019  PCP: Patient, No Pcp Per   Patient coming from: Home  I have personally briefly reviewed patient's old medical records in [facility]  Chief Complaint: Cough and shortness of breath  HPI: Albert Hammond is a 28 y.o. male with no significant past medical history scented to ED with worsening cough and shortness of breath. He was tested positive for COVID-19 5 days ago.  Did not had any treatment at home. Patient developed fever, chills and shortness of breath approximately 10 days ago.  Have it tested 5 days ago which was positive.  By the time he was tested his fever and chill was resolved.  Denies any nausea, vomiting or diarrhea.  He continued to experience worsening shortness of breath and came to ED today for evaluation at the request of his wife.  His wife is also tested positive but apparently doing well. He denies any prior history of diabetes.  Has a positive family history of diabetes in his mother.  Denies smoking or illicit drug use.  Socially drink.  ED Course: On arrival to ED he was afebrile, tachycardic and tachypneic and saturating in 60s.  Saturation improved to low 90s initially with nonrebreather and then he was placed on HFNC on 50 L. Procalcitonin was positive at 0.15. Inflammatory markers were elevated with D-dimer of 2772, LDH of 473, ferritin of 899 fibrinogen above 750.  CMP was positive for anion gap metabolic acidosis with bicarb of 20 and gap of 19 and blood glucose of 346.  Review of Systems: As per HPI otherwise 10 point review of systems negative.   History reviewed. No pertinent past medical history.  History reviewed. No pertinent surgical history.   reports that he has never smoked. He has never used smokeless tobacco. He reports current alcohol use. He reports that he does not use drugs.  No Known Allergies  No family history on  file.  Prior to Admission medications   Medication Sig Start Date End Date Taking? Authorizing Provider  benzonatate (TESSALON) 100 MG capsule Take 1-2 capsules (100-200 mg total) by mouth every 8 (eight) hours. 10/11/19   Georgetta Haber, NP    Physical Exam: Vitals:   10/13/19 1432 10/13/19 1433 10/13/19 1600  BP: 115/63    Pulse: (!) 117    Resp: (!) 28    Temp: 98.4 F (36.9 C)    TempSrc: Oral    SpO2: (!) 88%  92%  Weight:  90.7 kg   Height:  5\' 7"  (1.702 m)     General: Vital signs reviewed.  Patient is well-developed and well-nourished, in no acute distress and cooperative with exam.  Head: Normocephalic and atraumatic. Eyes: EOMI, conjunctivae normal, no scleral icterus.  ENMT: Mucous membranes are moist. Posterior pharynx clear of any exudate or lesions.Normal dentition.  Neck: Supple, trachea midline, normal ROM, no JVD, masses, thyromegaly, or carotid bruit present.  Cardiovascular: RRR, S1 normal, S2 normal, no murmurs, gallops, or rubs. Pulmonary/Chest: Bilateral scattered crackles and wheeze. Abdominal: Soft, non-tender, non-distended, BS +, no masses, organomegaly, or guarding present.  Musculoskeletal: No joint deformities, erythema, or stiffness, ROM full and nontender. Extremities: No lower extremity edema bilaterally,  pulses symmetric and intact bilaterally. No cyanosis or clubbing. Neurological: A&O x3, Strength is normal and symmetric bilaterally, cranial nerve II-XII are grossly intact, no focal motor deficit, sensory intact to light touch bilaterally.  Skin: Warm, dry and intact. No  rashes or erythema. Psychiatric: Normal mood and affect. speech and behavior is normal. Cognition and memory are normal.   Labs on Admission: I have personally reviewed following labs and imaging studies  CBC: Recent Labs  Lab 10/13/19 1459  WBC 7.9  NEUTROABS 5.9  HGB 14.4  HCT 41.7  MCV 81.3  PLT 229   Basic Metabolic Panel: Recent Labs  Lab 10/13/19 1459  NA  136  K 4.1  CL 97*  CO2 20*  GLUCOSE 346*  BUN 13  CREATININE 0.68  CALCIUM 8.9   GFR: Estimated Creatinine Clearance: 147.6 mL/min (by C-G formula based on SCr of 0.68 mg/dL). Liver Function Tests: Recent Labs  Lab 10/13/19 1459  AST 51*  ALT 31  ALKPHOS 111  BILITOT 1.0  PROT 7.8  ALBUMIN 3.1*   No results for input(s): LIPASE, AMYLASE in the last 168 hours. No results for input(s): AMMONIA in the last 168 hours. Coagulation Profile: No results for input(s): INR, PROTIME in the last 168 hours. Cardiac Enzymes: No results for input(s): CKTOTAL, CKMB, CKMBINDEX, TROPONINI in the last 168 hours. BNP (last 3 results) No results for input(s): PROBNP in the last 8760 hours. HbA1C: No results for input(s): HGBA1C in the last 72 hours. CBG: Recent Labs  Lab 10/13/19 1728  GLUCAP 308*   Lipid Profile: Recent Labs    10/13/19 1459  TRIG 221*   Thyroid Function Tests: No results for input(s): TSH, T4TOTAL, FREET4, T3FREE, THYROIDAB in the last 72 hours. Anemia Panel: Recent Labs    10/13/19 1459  FERRITIN 899*   Urine analysis: No results found for: COLORURINE, APPEARANCEUR, LABSPEC, PHURINE, GLUCOSEU, HGBUR, BILIRUBINUR, KETONESUR, PROTEINUR, UROBILINOGEN, NITRITE, LEUKOCYTESUR  Radiological Exams on Admission: CT ANGIO CHEST PE W OR WO CONTRAST  Result Date: 10/13/2019 CLINICAL DATA:  Covid +, hypoxia^27mL OMNIPAQUE IOHEXOL 350 MG/ML SOLNHypoxemia EXAM: CT ANGIOGRAPHY CHEST WITH CONTRAST TECHNIQUE: Multidetector CT imaging of the chest was performed using the standard protocol during bolus administration of intravenous contrast. Multiplanar CT image reconstructions and MIPs were obtained to evaluate the vascular anatomy. CONTRAST:  64mL OMNIPAQUE IOHEXOL 350 MG/ML SOLN COMPARISON:  Radiograph same day FINDINGS: Cardiovascular: No filling defects within the pulmonary arteries to suggest acute pulmonary embolism. No acute findings of the aorta or great vessels. No  pericardial fluid. Mediastinum/Nodes: No axillary or supraclavicular adenopathy. No mediastinal hilar adenopathy. No pericardial fluid Lungs/Pleura: multinodular airspace disease the upper lobes surrounded by ground-glass opacities. Dense consolidation in the RIGHT lower lobe. Medial consolidation LEFT lower lobe. Upper Abdomen: Limited view of the liver, kidneys, pancreas are unremarkable. Normal adrenal glands. Musculoskeletal: No aggressive osseous lesion. Review of the MIP images confirms the above findings. IMPRESSION: 1. No evidence acute pulmonary embolism. 2. Severe bilateral multifocal pneumonia. Upper lobe nodular airspace disease with peripheral gland glass opacities. Dense consolidation in the lower lobes, greater on the RIGHT. Electronically Signed   By: Suzy Bouchard M.D.   On: 10/13/2019 17:19   DG Chest Port 1 View  Result Date: 10/13/2019 CLINICAL DATA:  Shortness of breath, cough EXAM: PORTABLE CHEST 1 VIEW COMPARISON:  None. FINDINGS: Patchy bilateral airspace opacities. Heart is normal size. Low lung volumes. No effusions or pneumothorax. No acute bony abnormality. IMPRESSION: Patchy bilateral airspace disease compatible with multifocal pneumonia. Electronically Signed   By: Rolm Baptise M.D.   On: 10/13/2019 15:19    EKG: Independently reviewed.  Consistent with sinus tachycardia, no ST changes.  Assessment/Plan Active Problems:   Pneumonia due to COVID-19 virus  Acute hypoxic respiratory failure secondary to Covid 19 pneumonia. Requiring up to 50 L of oxygen through high flow nasal cannula. Currently elevated inflammatory markers. Bed request was placed for [facility], they will transfer the patient once bed become available. -Ordered CTA to rule out PE-it was negative for PE but shows bilateral opacities. -Start him on remdesivir and Decadron. -Start him on vitamin C, zinc and vitamin D supplement.  Hyperglycemia.  No prior history of diabetes.  CBG markedly  elevated. -Check A1c. -Check urinalysis to rule out ketones-positive for ketones we will follow DKA protocol. -Give him normal saline 2 L bolus. -Followed by normal saline at 200 mL/h -NovoLog 15 units. -CBG monitoring every 4 hourly with resistant scale SSI. -Check BMP around 7 PM.  DVT prophylaxis: Lovenox Code Status: Full code Family Communication:  [facility] Consults called: None Admission status: Inpatient   Arnetha CourserSumayya Nakyra Bourn MD [facility] Pager [phone]  If 7PM-7AM, please contact night-coverage [email or web address] Password [email or web address]  10/13/2019, 6:01 PM   This record has been created using Conservation officer, historic buildingsDragon voice recognition software. Errors have been sought and corrected,but may not always be located. Such creation errors do not reflect on the standard of care.

## 2019-10-13 NOTE — ED Provider Notes (Signed)
Pt going to [place] for evaluation.    Orvan July, NP 10/13/19 1116

## 2019-10-13 NOTE — ED Triage Notes (Signed)
Pt presents to the [facility] with SOB x 1 week. Pt states he [place] here to have a x ray. Pt tested positive for Covid 5 days ago.

## 2019-10-13 NOTE — ED Notes (Signed)
Patient given dinner tray. Sitting up in bed eating at this time.

## 2019-10-13 NOTE — Discharge Instructions (Signed)
Go to the urgent care to be seen in person for eval.

## 2019-10-14 ENCOUNTER — Encounter (HOSPITAL_COMMUNITY): Payer: Self-pay | Admitting: Family Medicine

## 2019-10-14 DIAGNOSIS — J9601 Acute respiratory failure with hypoxia: Secondary | ICD-10-CM

## 2019-10-14 DIAGNOSIS — E1165 Type 2 diabetes mellitus with hyperglycemia: Secondary | ICD-10-CM

## 2019-10-14 LAB — CBC WITH DIFFERENTIAL/PLATELET
Abs Immature Granulocytes: 0.05 10*3/uL (ref 0.00–0.07)
Basophils Absolute: 0 10*3/uL (ref 0.0–0.1)
Basophils Relative: 0 %
Eosinophils Absolute: 0 10*3/uL (ref 0.0–0.5)
Eosinophils Relative: 0 %
HCT: 42.9 % (ref 39.0–52.0)
Hemoglobin: 13.7 g/dL (ref 13.0–17.0)
Immature Granulocytes: 1 %
Lymphocytes Relative: 17 %
Lymphs Abs: 1.3 10*3/uL (ref 0.7–4.0)
MCH: 28.1 pg (ref 26.0–34.0)
MCHC: 31.9 g/dL (ref 30.0–36.0)
MCV: 88.1 fL (ref 80.0–100.0)
Monocytes Absolute: 0.2 10*3/uL (ref 0.1–1.0)
Monocytes Relative: 3 %
Neutro Abs: 6 10*3/uL (ref 1.7–7.7)
Neutrophils Relative %: 79 %
Platelets: 260 10*3/uL (ref 150–400)
RBC: 4.87 MIL/uL (ref 4.22–5.81)
RDW: 12.4 % (ref 11.5–15.5)
WBC: 7.6 10*3/uL (ref 4.0–10.5)
nRBC: 0 % (ref 0.0–0.2)

## 2019-10-14 LAB — COMPREHENSIVE METABOLIC PANEL
ALT: 29 U/L (ref 0–44)
AST: 48 U/L — ABNORMAL HIGH (ref 15–41)
Albumin: 2.8 g/dL — ABNORMAL LOW (ref 3.5–5.0)
Alkaline Phosphatase: 117 U/L (ref 38–126)
Anion gap: 14 (ref 5–15)
BUN: 15 mg/dL (ref 6–20)
CO2: 23 mmol/L (ref 22–32)
Calcium: 8.3 mg/dL — ABNORMAL LOW (ref 8.9–10.3)
Chloride: 101 mmol/L (ref 98–111)
Creatinine, Ser: 0.63 mg/dL (ref 0.61–1.24)
GFR calc Af Amer: >60 mL/min (ref >60)
GFR calc non Af Amer: >60 mL/min (ref >60)
Glucose, Bld: 308 mg/dL — ABNORMAL HIGH (ref 70–99)
Potassium: 4.1 mmol/L (ref 3.5–5.1)
Sodium: 138 mmol/L (ref 135–145)
Total Bilirubin: 0.9 mg/dL (ref 0.3–1.2)
Total Protein: 7.1 g/dL (ref 6.5–8.1)

## 2019-10-14 LAB — GLUCOSE, CAPILLARY
Glucose-Capillary: 328 mg/dL — ABNORMAL HIGH (ref 70–99)
Glucose-Capillary: 273 mg/dL — ABNORMAL HIGH (ref 70–99)
Glucose-Capillary: 324 mg/dL — ABNORMAL HIGH (ref 70–99)
Glucose-Capillary: 336 mg/dL — ABNORMAL HIGH (ref 70–99)
Glucose-Capillary: 551 mg/dL (ref 70–99)
Glucose-Capillary: 469 mg/dL — ABNORMAL HIGH (ref 70–99)

## 2019-10-14 LAB — HEMOGLOBIN A1C
Hgb A1c MFr Bld: 15.6 % — ABNORMAL HIGH (ref 4.8–5.6)
Mean Plasma Glucose: 401.02 mg/dL

## 2019-10-14 LAB — HIV ANTIBODY (ROUTINE TESTING W REFLEX): HIV Screen 4th Generation wRfx: NONREACTIVE

## 2019-10-14 LAB — C-REACTIVE PROTEIN: CRP: 38.3 mg/dL — ABNORMAL HIGH (ref <1.0)

## 2019-10-14 LAB — D-DIMER, QUANTITATIVE: D-Dimer, Quant: 2.81 ug/mL-FEU — ABNORMAL HIGH (ref 0.00–0.50)

## 2019-10-14 MED ORDER — INSULIN ASPART 100 UNIT/ML ~~LOC~~ SOLN
4.0000 [IU] | Freq: Three times a day (TID) | SUBCUTANEOUS | Status: DC
  Administered 2019-10-15 (×2): 4 [IU] via SUBCUTANEOUS

## 2019-10-14 MED ORDER — LIVING WELL WITH DIABETES BOOK
Freq: Once | Status: AC
  Filled 2019-10-14: qty 1

## 2019-10-14 MED ORDER — TOCILIZUMAB 400 MG/20ML IV SOLN
800.0000 mg | Freq: Once | INTRAVENOUS | Status: AC
  Administered 2019-10-14: 800 mg via INTRAVENOUS
  Filled 2019-10-14: qty 40

## 2019-10-14 MED ORDER — HYDROCOD POLST-CPM POLST ER 10-8 MG/5ML PO SUER
5.0000 mL | Freq: Two times a day (BID) | ORAL | Status: DC | PRN
  Administered 2019-10-15 – 2019-10-21 (×8): 5 mL via ORAL
  Filled 2019-10-14 (×9): qty 5

## 2019-10-14 MED ORDER — INSULIN ASPART 100 UNIT/ML ~~LOC~~ SOLN
30.0000 [IU] | Freq: Once | SUBCUTANEOUS | Status: AC
  Administered 2019-10-14: 30 [IU] via SUBCUTANEOUS

## 2019-10-14 MED ORDER — SODIUM CHLORIDE 0.9% IV SOLUTION: Freq: Once | INTRAVENOUS | Status: AC

## 2019-10-14 MED ORDER — ACETAMINOPHEN 325 MG PO TABS
650.0000 mg | ORAL_TABLET | Freq: Four times a day (QID) | ORAL | Status: DC | PRN
  Administered 2019-10-14: 650 mg via ORAL

## 2019-10-14 MED ORDER — FUROSEMIDE 10 MG/ML IJ SOLN
40.0000 mg | Freq: Once | INTRAMUSCULAR | Status: AC
  Administered 2019-10-14: 40 mg via INTRAVENOUS
  Filled 2019-10-14: qty 4

## 2019-10-14 MED ORDER — METHYLPREDNISOLONE SODIUM SUCC 125 MG IJ SOLR
60.0000 mg | Freq: Two times a day (BID) | INTRAMUSCULAR | Status: AC
  Administered 2019-10-14 – 2019-10-15 (×4): 60 mg via INTRAVENOUS
  Filled 2019-10-14 (×4): qty 2

## 2019-10-14 MED ORDER — INSULIN DETEMIR 100 UNIT/ML ~~LOC~~ SOLN
12.0000 [IU] | Freq: Two times a day (BID) | SUBCUTANEOUS | Status: DC
  Administered 2019-10-14 – 2019-10-15 (×2): 12 [IU] via SUBCUTANEOUS
  Filled 2019-10-14 (×2): qty 0.12

## 2019-10-14 MED ORDER — FAMOTIDINE 20 MG PO TABS
20.0000 mg | ORAL_TABLET | Freq: Every day | ORAL | Status: DC
  Administered 2019-10-14 – 2019-10-22 (×9): 20 mg via ORAL
  Filled 2019-10-14 (×9): qty 1

## 2019-10-14 NOTE — Progress Notes (Addendum)
Inpatient Diabetes Program Recommendations  AACE/ADA: New Consensus Statement on Inpatient Glycemic Control (2015)  Target Ranges:  Prepandial:   less than 140 mg/dL      Peak postprandial:   less than 180 mg/dL (1-2 hours)      Critically ill patients:  140 - 180 mg/dL   Lab Results  Component Value Date   GLUCAP 273 (H) 10/14/2019   HGBA1C 15.6 (H) 10/13/2019    Review of Glycemic Control Results for Albert Hammond, Albert Hammond (MRN [id number]) as of 10/14/2019 11:12  Ref. Range 10/13/2019 18:56 10/13/2019 22:12 10/14/2019 00:42 10/14/2019 03:50 10/14/2019 08:05  Glucose-Capillary Latest Ref Range: 70 - 99 mg/dL 267 (H) 308 (H) 328 (H) 324 (H) 273 (H)   Diabetes history: New diagnosis of Type 2 DM Outpatient Diabetes medications: None Current orders for Inpatient glycemic control: Lantus 10 units q HS, Novolog 0-6 units tid with meals  Solumedrol 60 mg IV q 12 hours Inpatient Diabetes Program Recommendations:    Please consider d/c of Lantus and add Levemir 12 units bid  Per COVID order set (0.2 unit/kg).  Also consider changing Novolog correction to moderate tid with meals and add Novolog meal coverage 4 units tid with meals.    **Note new diagnosis of DM.  Will order Living well with DM booklet for patient and discuss new diagnosis when patient appropriate for education.    Thanks, Adah Perl, RN, BC-ADM Inpatient Diabetes Coordinator Pager [phone] (8a-5p)

## 2019-10-14 NOTE — Progress Notes (Signed)
Patient does not wish the nurse to update his family at this time. States he calls them and has been keeping them up to date.

## 2019-10-14 NOTE — Plan of Care (Signed)
  Problem: Education: Goal: Knowledge of risk factors and measures for prevention of condition will improve Outcome: Progressing   Problem: Coping: Goal: Psychosocial and spiritual needs will be supported Outcome: Progressing   Problem: Respiratory: Goal: Will maintain a patent airway Outcome: Progressing Goal: Complications related to the disease process, condition or treatment will be avoided or minimized Outcome: Progressing   Problem: Education: Goal: Knowledge of General Education information will improve Description: Including pain rating scale, medication(s)/side effects and non-pharmacologic comfort measures Outcome: Progressing   Problem: Health Behavior/Discharge Planning: Goal: Ability to manage health-related needs will improve Outcome: Progressing   Problem: Clinical Measurements: Goal: Ability to maintain clinical measurements within normal limits will improve Outcome: Progressing Goal: Will remain free from infection Outcome: Progressing Goal: Respiratory complications will improve Outcome: Progressing Goal: Cardiovascular complication will be avoided Outcome: Progressing   Problem: Activity: Goal: Risk for activity intolerance will decrease Outcome: Progressing   Problem: Nutrition: Goal: Adequate nutrition will be maintained Outcome: Progressing   Problem: Coping: Goal: Level of anxiety will decrease Outcome: Progressing   Problem: Safety: Goal: Ability to remain free from injury will improve Outcome: Progressing   Problem: Skin Integrity: Goal: Risk for impaired skin integrity will decrease Outcome: Progressing

## 2019-10-14 NOTE — Progress Notes (Signed)
PROGRESS NOTE  Albert Hammond ZOX:[id number] DOB: 03/28/1991 DOA: 10/13/2019  PCP: Patient, No Pcp Per  Brief History/Interval Summary: 28 y.o. male with no previous past medical history presented to the emergency department with worsening shortness of breath and cough. He tested positive for Covid 5 days prior to admission.  Noted to be saturating in the 60s on room air.  He was placed on high flow nasal cannula at 15 L/min.  He was given remdesivir and dexamethasone.  Transferred to [facility].   Reason for Visit: Acute respiratory failure with hypoxia.  Pneumonia due to COVID-19.  Consultants: None  Procedures: None  Antibiotics: Anti-infectives (From admission, onward)   Start     Dose/Rate Route Frequency Ordered Stop   10/14/19 1000  remdesivir 100 mg in sodium chloride 0.9 % 100 mL IVPB  Status:  Discontinued     100 mg 200 mL/hr over 30 Minutes Intravenous Daily 10/13/19 2007 10/13/19 2009   10/14/19 1000  remdesivir 100 mg in sodium chloride 0.9 % 100 mL IVPB     100 mg 200 mL/hr over 30 Minutes Intravenous Daily 10/13/19 2010 10/18/19 0959   10/13/19 2015  remdesivir 200 mg in sodium chloride 0.9% 250 mL IVPB  Status:  Discontinued     200 mg 580 mL/hr over 30 Minutes Intravenous Once 10/13/19 2007 10/13/19 2009   10/13/19 2015  remdesivir 200 mg in sodium chloride 0.9% 250 mL IVPB  Status:  Discontinued     200 mg 580 mL/hr over 30 Minutes Intravenous Once 10/13/19 2013 10/13/19 2013      Subjective/Interval History: Patient states that he is feeling short of breath.  However he states that is not struggling to breathe.  Continues to have a dry cough.  No chest pain.  No nausea or vomiting.  Able to speak full sentences.    Assessment/Plan:  Acute Hypoxic Resp. Failure/Pneumonia due to COVID-19  COVID-19 Labs   Lab Results  Component Value Date   SARSCOV2NAA Detected (A) 10/08/2019     Recent Labs  Lab 10/13/19 1459 10/14/19 0318    DDIMER  --  2.81*  FERRITIN 899*  --   CRP 47.5* 38.3*  ALT 31 29  PROCALCITON 0.15  --     Objective findings: Fever: Afebrile Oxygen requirements: He is on high flow [place] at 15 L and he is on nonrebreather at 15 L.  Saturating in the early 90s.  COVID 19 Therapeutics: Antibacterials: None Remdesivir: Day 2 today Steroids: Changed over to Solu-Medrol 60 mg twice a day Diuretics: Will order 1 dose of Lasix today. Actemra: 1 dose of Actemra ordered today Convalescent Plasma: Patient has been consented for plasma Vitamin C and Zinc: Continue PUD Prophylaxis: Will initiate Pepcid DVT Prophylaxis:  Lovenox 45 mg twice daily  Patient with tenuous respiratory status.  He is on very high amounts of oxygen.  He is however not struggling to breathe.  Does not have a very high work of breathing.  He is however at very high risk for decompensation.  Continue with remdesivir and steroids.  CRP noted to be extremely elevated at 47.5 and improved slightly to 38.3 this morning.  D-dimer 2.81.  Procalcitonin was only 0.15.  Noted to be on higher dose of Lovenox.  Continue for now.  CT angiogram done at the time of admission did not show any PE.  The treatment plan and use of medications and known side effects were discussed with patient and his father. Some  of the medications used are based on case reports/anecdotal data.  Steroids have shown some benefit in treatment of COVID-19 based on at least one study.  Another medication that has been used is Actemra (Tocilizumab). However randomized control trials involving this drug have not shown any benefit although the final reports have not been published yet.  Complete risks and long-term side effects are unknown, however in the best clinical judgment they seem to be of some benefit.  Despite lack of benefit noted on preliminary reports from RCT's patient and his father does want this medication knowing that this is considered experimental treatment.     Convalescent plasma was also discussed with the patient and his father.  Consent forms were given to the patient.  He was given an opportunity to ask questions.  The forms were given in Spanish language.  Patient is able to read and write.  He understood the form.  After a lot of discussions and multiple questions which were answered he agrees to receiving plasma.  The form was signed.  Its in the shadow chart.  Newly diagnosed diabetes mellitus type II, uncontrolled with hyperglycemia/questionable mild DKA Patient did not have any previous history of diabetes.  He was found to have hyperglycemia at the time of admission.  HbA1c is 15.6.  Patient started on SSI.  Also on long-acting insulin.  Will need to adjust dose depending on his glucose levels.  HIV nonreactive.  Anion gap noted to be normal this morning.  Was noted to be 19 yesterday.  Obesity Estimated body mass index is 40.36 kg/m as calculated from the following:   Height as of this encounter:  (1.702 m).   Weight as of this encounter: 116.9 kg.   DVT Prophylaxis: Higher dose Lovenox Code Status: Full code Family Communication: Discussed with the patient and his father Disposition Plan: Remains very tenuous.  High risk for transfer to ICU.   Medications:  Scheduled: . sodium chloride   Intravenous Once  . vitamin C  500 mg Oral Daily  . enoxaparin (LOVENOX) injection  0.5 mg/kg Subcutaneous Q12H  . insulin aspart  0-6 Units Subcutaneous TID WC  . insulin glargine  10 Units Subcutaneous QHS  . methylPREDNISolone (SOLU-MEDROL) injection  60 mg Intravenous Q12H  . sodium chloride flush  3 mL Intravenous Q12H  . zinc sulfate  220 mg Oral Daily   Continuous: . sodium chloride    . remdesivir 100 mg in NS 100 mL    . tocilizumab (ACTEMRA) IV     ZOX:WRUEAV chloride, acetaminophen, acetaminophen, chlorpheniramine-HYDROcodone, ondansetron **OR** ondansetron (ZOFRAN) IV, sodium chloride flush   Objective:  Vital  Signs  Vitals:   10/14/19 0100 10/14/19 0125 10/14/19 0500 10/14/19 0800  BP: (!) 145/82  128/82 135/86  Pulse: (!) 102  92 95  Resp: 20  (!) 22 18  Temp:   99.2 F (37.3 C) (!) 97.5 F (36.4 C)  TempSrc:   Axillary Axillary  SpO2: 93%  93% 90%  Weight:  116.9 kg    Height:   (1.702 m)      Intake/Output Summary (Last 24 hours) at 10/14/2019 1023 Last data filed at 10/14/2019 0342 Gross per 24 hour  Intake 0 ml  Output 1550 ml  Net -1550 ml   Filed Weights   10/14/19 0125  Weight: 116.9 kg    General appearance: Awake alert.  In no distress Resp: Mildly tachypneic.  Coarse breath sounds bilaterally.  Crackles bilaterally.  No wheezing  or rhonchi.  Cardio: S1-S2 is normal regular.  No S3-S4.  No rubs murmurs or bruit GI: Abdomen is soft.  Nontender nondistended.  Bowel sounds are present normal.  No masses organomegaly Extremities: No edema.  Full range of motion of lower extremities. Neurologic: Alert and oriented x3.  No focal neurological deficits.    Lab Results:  Data Reviewed: I have personally reviewed following labs and imaging studies  CBC: Recent Labs  Lab 10/13/19 1459 10/14/19 0318  WBC 7.9 7.6  NEUTROABS 5.9 6.0  HGB 14.4 13.7  HCT 41.7 42.9  MCV 81.3 88.1  PLT 225 989    Basic Metabolic Panel: Recent Labs  Lab 10/13/19 1459 10/13/19 2150 10/14/19 0318  NA 136 137 138  K 4.1 4.5 4.1  CL 97* 102 101  CO2 20* 20* 23  GLUCOSE 346* 339* 308*  BUN 13 15 15   CREATININE 0.68 0.73 0.63  CALCIUM 8.9 8.1* 8.3*    GFR: Estimated Creatinine Clearance: 168 mL/min (by C-G formula based on SCr of 0.63 mg/dL).  Liver Function Tests: Recent Labs  Lab 10/13/19 1459 10/14/19 0318  AST 51* 48*  ALT 31 29  ALKPHOS 111 117  BILITOT 1.0 0.9  PROT 7.8 7.1  ALBUMIN 3.1* 2.8*     HbA1C: Recent Labs    10/13/19 2150  HGBA1C 15.6*    CBG: Recent Labs  Lab 10/13/19 1728 10/13/19 1856 10/13/19 2212 10/14/19 0042 10/14/19 0805   GLUCAP 308* 267* 308* 328* 273*    Lipid Profile: Recent Labs    10/13/19 1459  TRIG 221*     Anemia Panel: Recent Labs    10/13/19 1459  FERRITIN 899*    Recent Results (from the past 240 hour(s))  Novel Coronavirus, NAA ([facility])     Status: Abnormal   Collection Time: 10/08/19 12:00 AM   Specimen: Oropharyngeal(OP) collection in vial transport medium   OROPHARYNGEA  TESTING  Result Value Ref Range Status   SARS-CoV-2, NAA Detected (A) Not Detected Final    Comment: This nucleic acid amplification test was developed and its performance characteristics determined by [employer]. Nucleic acid amplification tests include PCR and TMA. This test has not been FDA cleared or approved. This test has been authorized by FDA under an Emergency Use Authorization (EUA). This test is only authorized for the duration of time the declaration that circumstances exist justifying the authorization of the emergency use of in vitro diagnostic tests for detection of SARS-CoV-2 virus and/or diagnosis of COVID-19 infection under section 564(b)(1) of the Act, 21 U.S.C. 211HER-7(E) (1), unless the authorization is terminated or revoked sooner. When diagnostic testing is negative, the possibility of a false negative result should be considered in the context of a patient's recent exposures and the presence of clinical signs and symptoms consistent with COVID-19. An individual without symptoms of COVID-19 and who is not shedding SARS-CoV-2 virus would  expect to have a negative (not detected) result in this assay.   Blood Culture (routine x 2)     Status: None (Preliminary result)   Collection Time: 10/13/19  2:59 PM   Specimen: BLOOD  Result Value Ref Range Status   Specimen Description BLOOD BLOOD RIGHT WRIST  Final   Special Requests   Final    BOTTLES DRAWN AEROBIC AND ANAEROBIC Blood Culture adequate volume   Culture   Final    NO GROWTH < 24 HOURS Performed at [facility], [address], [place], [place] [address]  Report Status PENDING  Incomplete  Blood Culture (routine x 2)     Status: None (Preliminary result)   Collection Time: 10/13/19  2:59 PM   Specimen: BLOOD  Result Value Ref Range Status   Specimen Description BLOOD LEFT ANTECUBITAL  Final   Special Requests   Final    BOTTLES DRAWN AEROBIC AND ANAEROBIC Blood Culture adequate volume   Culture   Final    NO GROWTH < 24 HOURS Performed at [facility], [address] [place], [place] [address]    Report Status PENDING  Incomplete      Radiology Studies: CT ANGIO CHEST PE W OR WO CONTRAST  Result Date: 10/13/2019 CLINICAL DATA:  Covid +, hypoxia^77mL OMNIPAQUE IOHEXOL 350 MG/ML SOLNHypoxemia EXAM: CT ANGIOGRAPHY CHEST WITH CONTRAST TECHNIQUE: Multidetector CT imaging of the chest was performed using the standard protocol during bolus administration of intravenous contrast. Multiplanar CT image reconstructions and MIPs were obtained to evaluate the vascular anatomy. CONTRAST:  56mL OMNIPAQUE IOHEXOL 350 MG/ML SOLN COMPARISON:  Radiograph same day FINDINGS: Cardiovascular: No filling defects within the pulmonary arteries to suggest acute pulmonary embolism. No acute findings of the aorta or great vessels. No pericardial fluid. Mediastinum/Nodes: No axillary or supraclavicular adenopathy. No mediastinal hilar adenopathy. No pericardial fluid Lungs/Pleura: multinodular airspace disease the upper lobes surrounded by ground-glass opacities. Dense consolidation in the RIGHT lower lobe. Medial consolidation LEFT lower lobe. Upper Abdomen: Limited view of the liver, kidneys, pancreas are unremarkable. Normal adrenal glands. Musculoskeletal: No aggressive osseous lesion. Review of the MIP images confirms the above findings. IMPRESSION: 1. No evidence acute pulmonary embolism. 2. Severe bilateral multifocal pneumonia. Upper lobe nodular airspace disease with peripheral gland glass  opacities. Dense consolidation in the lower lobes, greater on the RIGHT. Electronically Signed   By: Genevive Bi M.D.   On: 10/13/2019 17:19   DG Chest Port 1 View  Result Date: 10/13/2019 CLINICAL DATA:  Shortness of breath, cough EXAM: PORTABLE CHEST 1 VIEW COMPARISON:  None. FINDINGS: Patchy bilateral airspace opacities. Heart is normal size. Low lung volumes. No effusions or pneumothorax. No acute bony abnormality. IMPRESSION: Patchy bilateral airspace disease compatible with multifocal pneumonia. Electronically Signed   By: Charlett Nose M.D.   On: 10/13/2019 15:19       LOS: 1 day   Micha Dosanjh  [facility] Pager on [email or web address]  10/14/2019, 10:23 AM

## 2019-10-14 NOTE — Progress Notes (Signed)
Patient's father Albert Hammond) called and updated on status. Running first unit of plasma. Patient is in no distress. Will continue to monitor.

## 2019-10-15 LAB — COMPREHENSIVE METABOLIC PANEL
ALT: 35 U/L (ref 0–44)
AST: 50 U/L — ABNORMAL HIGH (ref 15–41)
Albumin: 2.9 g/dL — ABNORMAL LOW (ref 3.5–5.0)
Alkaline Phosphatase: 133 U/L — ABNORMAL HIGH (ref 38–126)
Anion gap: 15 (ref 5–15)
BUN: 25 mg/dL — ABNORMAL HIGH (ref 6–20)
CO2: 23 mmol/L (ref 22–32)
Calcium: 8.9 mg/dL (ref 8.9–10.3)
Chloride: 99 mmol/L (ref 98–111)
Creatinine, Ser: 0.75 mg/dL (ref 0.61–1.24)
GFR calc Af Amer: >60 mL/min (ref >60)
GFR calc non Af Amer: >60 mL/min (ref >60)
Glucose, Bld: 414 mg/dL — ABNORMAL HIGH (ref 70–99)
Potassium: 4 mmol/L (ref 3.5–5.1)
Sodium: 137 mmol/L (ref 135–145)
Total Bilirubin: 0.9 mg/dL (ref 0.3–1.2)
Total Protein: 7 g/dL (ref 6.5–8.1)

## 2019-10-15 LAB — PREPARE FRESH FROZEN PLASMA

## 2019-10-15 LAB — D-DIMER, QUANTITATIVE: D-Dimer, Quant: 3.28 ug/mL-FEU — ABNORMAL HIGH (ref 0.00–0.50)

## 2019-10-15 LAB — CBC WITH DIFFERENTIAL/PLATELET
Abs Immature Granulocytes: 0.1 10*3/uL — ABNORMAL HIGH (ref 0.00–0.07)
Basophils Absolute: 0 10*3/uL (ref 0.0–0.1)
Basophils Relative: 0 %
Eosinophils Absolute: 0 10*3/uL (ref 0.0–0.5)
Eosinophils Relative: 0 %
HCT: 41.1 % (ref 39.0–52.0)
Hemoglobin: 13.2 g/dL (ref 13.0–17.0)
Immature Granulocytes: 1 %
Lymphocytes Relative: 17 %
Lymphs Abs: 1.8 10*3/uL (ref 0.7–4.0)
MCH: 27.8 pg (ref 26.0–34.0)
MCHC: 32.1 g/dL (ref 30.0–36.0)
MCV: 86.5 fL (ref 80.0–100.0)
Monocytes Absolute: 0.6 10*3/uL (ref 0.1–1.0)
Monocytes Relative: 6 %
Neutro Abs: 8.1 10*3/uL — ABNORMAL HIGH (ref 1.7–7.7)
Neutrophils Relative %: 76 %
Platelets: 356 10*3/uL (ref 150–400)
RBC: 4.75 MIL/uL (ref 4.22–5.81)
RDW: 12 % (ref 11.5–15.5)
WBC: 10.7 10*3/uL — ABNORMAL HIGH (ref 4.0–10.5)
nRBC: 0 % (ref 0.0–0.2)

## 2019-10-15 LAB — MAGNESIUM: Magnesium: 2.2 mg/dL (ref 1.7–2.4)

## 2019-10-15 LAB — HEMOGLOBIN A1C
Hgb A1c MFr Bld: 15.5 % — ABNORMAL HIGH (ref 4.8–5.6)
Mean Plasma Glucose: 398 mg/dL

## 2019-10-15 LAB — BPAM FFP
Blood Product Expiration Date: 202012161625
ISSUE DATE / TIME: 202012151846
Unit Type and Rh: 5100

## 2019-10-15 LAB — GLUCOSE, CAPILLARY
Glucose-Capillary: 354 mg/dL — ABNORMAL HIGH (ref 70–99)
Glucose-Capillary: 422 mg/dL — ABNORMAL HIGH (ref 70–99)
Glucose-Capillary: 448 mg/dL — ABNORMAL HIGH (ref 70–99)
Glucose-Capillary: 409 mg/dL — ABNORMAL HIGH (ref 70–99)

## 2019-10-15 LAB — C-REACTIVE PROTEIN: CRP: 21.7 mg/dL — ABNORMAL HIGH (ref <1.0)

## 2019-10-15 LAB — GLUCOSE, RANDOM: Glucose, Bld: 408 mg/dL — ABNORMAL HIGH (ref 70–99)

## 2019-10-15 LAB — FERRITIN: Ferritin: 960 ng/mL — ABNORMAL HIGH (ref 24–336)

## 2019-10-15 MED ORDER — LIVING WELL WITH DIABETES BOOK - IN SPANISH
Freq: Once | Status: AC
  Filled 2019-10-15: qty 1

## 2019-10-15 MED ORDER — INSULIN ASPART 100 UNIT/ML ~~LOC~~ SOLN
0.0000 [IU] | Freq: Three times a day (TID) | SUBCUTANEOUS | Status: DC
  Administered 2019-10-15 – 2019-10-16 (×4): 20 [IU] via SUBCUTANEOUS
  Administered 2019-10-17: 17:00:00 11 [IU] via SUBCUTANEOUS
  Administered 2019-10-17: 4 [IU] via SUBCUTANEOUS
  Administered 2019-10-18: 3 [IU] via SUBCUTANEOUS
  Administered 2019-10-18 (×2): 7 [IU] via SUBCUTANEOUS
  Administered 2019-10-19 (×2): 4 [IU] via SUBCUTANEOUS
  Administered 2019-10-20: 12:00:00 7 [IU] via SUBCUTANEOUS
  Administered 2019-10-20: 11 [IU] via SUBCUTANEOUS
  Administered 2019-10-21: 15 [IU] via SUBCUTANEOUS
  Administered 2019-10-21: 13:00:00 4 [IU] via SUBCUTANEOUS
  Administered 2019-10-22: 7 [IU] via SUBCUTANEOUS
  Administered 2019-10-22: 11 [IU] via SUBCUTANEOUS

## 2019-10-15 MED ORDER — INSULIN ASPART 100 UNIT/ML ~~LOC~~ SOLN
10.0000 [IU] | Freq: Three times a day (TID) | SUBCUTANEOUS | Status: DC
  Administered 2019-10-16 (×2): 10 [IU] via SUBCUTANEOUS

## 2019-10-15 MED ORDER — DM-GUAIFENESIN ER 30-600 MG PO TB12
1.0000 | ORAL_TABLET | Freq: Two times a day (BID) | ORAL | Status: DC
  Administered 2019-10-15 – 2019-10-22 (×15): 1 via ORAL
  Filled 2019-10-15 (×15): qty 1

## 2019-10-15 MED ORDER — INSULIN ASPART 100 UNIT/ML ~~LOC~~ SOLN
8.0000 [IU] | Freq: Three times a day (TID) | SUBCUTANEOUS | Status: DC
  Administered 2019-10-15: 8 [IU] via SUBCUTANEOUS

## 2019-10-15 MED ORDER — INSULIN DETEMIR 100 UNIT/ML ~~LOC~~ SOLN
8.0000 [IU] | Freq: Once | SUBCUTANEOUS | Status: AC
  Administered 2019-10-15: 8 [IU] via SUBCUTANEOUS
  Filled 2019-10-15: qty 0.08

## 2019-10-15 MED ORDER — DEXAMETHASONE SODIUM PHOSPHATE 10 MG/ML IJ SOLN
6.0000 mg | INTRAMUSCULAR | Status: AC
  Administered 2019-10-16 – 2019-10-20 (×5): 6 mg via INTRAVENOUS
  Filled 2019-10-15 (×5): qty 1

## 2019-10-15 MED ORDER — INSULIN DETEMIR 100 UNIT/ML ~~LOC~~ SOLN
20.0000 [IU] | Freq: Two times a day (BID) | SUBCUTANEOUS | Status: DC
  Administered 2019-10-15: 20 [IU] via SUBCUTANEOUS
  Filled 2019-10-15 (×2): qty 0.2

## 2019-10-15 MED ORDER — INSULIN ASPART 100 UNIT/ML ~~LOC~~ SOLN
14.0000 [IU] | Freq: Once | SUBCUTANEOUS | Status: AC
  Administered 2019-10-15: 14 [IU] via SUBCUTANEOUS

## 2019-10-15 MED ORDER — INSULIN ASPART 100 UNIT/ML ~~LOC~~ SOLN
0.0000 [IU] | Freq: Every day | SUBCUTANEOUS | Status: DC
  Administered 2019-10-16: 5 [IU] via SUBCUTANEOUS
  Administered 2019-10-17: 2 [IU] via SUBCUTANEOUS
  Administered 2019-10-18: 3 [IU] via SUBCUTANEOUS
  Administered 2019-10-20: 4 [IU] via SUBCUTANEOUS
  Administered 2019-10-21: 3 [IU] via SUBCUTANEOUS

## 2019-10-15 NOTE — Progress Notes (Addendum)
Inpatient Diabetes Program Recommendations  AACE/ADA: New Consensus Statement on Inpatient Glycemic Control (2015)  Target Ranges:  Prepandial:   less than 140 mg/dL      Peak postprandial:   less than 180 mg/dL (1-2 hours)      Critically ill patients:  140 - 180 mg/dL   Lab Results  Component Value Date   GLUCAP 354 (H) 10/15/2019   HGBA1C 15.6 (H) 10/13/2019    Review of Glycemic Control Diabetes history: New diagnosis of Type 2 DM Outpatient Diabetes medications: None Current orders for Inpatient glycemic control: Levemir 12 units bid, Novolog 0-6 units tid with meals, Novolog 4 units tid with meals Solumedrol 60 mg IV q 12 hours  Inpatient Diabetes Program Recommendations:    Please consider increasing Levemir to 18 units bid.  Also please increase Novolog correction to resistant and increase Novolog meal coverage to 8 units tid with meals.   Thanks,  Adah Perl, RN, BC-ADM Inpatient Diabetes Coordinator Pager [phone] (8a-5p)  Addendum:  Patient with new diagnosis of DM.  Ordered LWWD booklet in Spanish.  Based on A1C of >15%, he will need insulin at home.  May need affordable option such as 70/30 at discharge.  Will call patient to discuss once he is feeling a little better.

## 2019-10-15 NOTE — Progress Notes (Signed)
Albert Hammond  VHQ:[id number] DOB: 1991-04-03 DOA: 10/13/2019 PCP: Patient, No Pcp Per    Brief Narrative:  28 y.o.malewithno previous past medical history presented to the emergency department with worsening shortness of breath and cough. He tested positive for Covid 5 days prior to admission.  Noted to be saturating in the 60s on room air.  He was placed on high flow nasal cannula at 15 L/min.  He was given remdesivir and dexamethasone.  Transferred to [facility].  Significant Events:   COVID-19 specific Treatment:   Subjective: The patient reports that he is feeling better today.  He is somewhat less short of breath.  He denies chest pain nausea vomiting or abdominal pain.  He reports that his appetite is improving.  Assessment & Plan:   Acute Hypoxic Resp. Failure -COVID Pneumonia Persistent tenuous respiratory status requiring high amounts of oxygen though he is tolerating it well without marked distress - continue with remdesivir and steroids - CTa at admission did not show PE  Recent Labs  Lab 10/13/19 1459 10/14/19 0318 10/15/19 0058  DDIMER  --  2.81* 3.28*  FERRITIN 899*  --  960*  CRP 47.5* 38.3* 21.7*  ALT 31 29 35  PROCALCITON 0.15  --   --     Newly diagnosed DM2 - uncontrolled with severe hyperglycemia did not have a known history of diabetes - HbA1c is 15.6 -continue to adjust insulin therapy as CBGs remain very poorly controlled  Morbid obesity Estimated body mass index is 40.36 kg/m as calculated from the following:   Height as of this encounter: 5\' 7"  (1.702 m).   Weight as of this encounter: 116.9 kg.   DVT prophylaxis: Lovenox Code Status: FULL CODE Family Communication:  Disposition Plan: Progressive care  Consultants:  none  Antimicrobials:  None  Objective: Blood pressure 116/74, pulse 88, temperature 98.7 F (37.1 C), temperature source Oral, resp. rate 20, height 5\' 7"  (1.702 m), weight 116.9 kg, SpO2 98  %.  Intake/Output Summary (Last 24 hours) at 10/15/2019 1208 Last data filed at 10/15/2019 1046 Gross per 24 hour  Intake 2760 ml  Output 5300 ml  Net -2540 ml   Filed Weights   10/14/19 0125  Weight: 116.9 kg    Examination: General: No acute respiratory distress despite high oxygen requirement Lungs: Distant breath sounds to auscultation with faint crackles diffusely bilaterally with no wheezing Cardiovascular: Regular rate and rhythm without murmur gallop or rub normal S1 and S2 Abdomen: Nontender, nondistended, soft, bowel sounds positive, no rebound, no ascites, no appreciable mass Extremities: No significant cyanosis, clubbing, or edema bilateral lower extremities  CBC: Recent Labs  Lab 10/13/19 1459 10/14/19 0318 10/15/19 0058  WBC 7.9 7.6 10.7*  NEUTROABS 5.9 6.0 8.1*  HGB 14.4 13.7 13.2  HCT 41.7 42.9 41.1  MCV 81.3 88.1 86.5  PLT 225 260 413   Basic Metabolic Panel: Recent Labs  Lab 10/13/19 2150 10/14/19 0318 10/15/19 0058  NA 137 138 137  K 4.5 4.1 4.0  CL 102 101 99  CO2 20* 23 23  GLUCOSE 339* 308* 414*  BUN 15 15 25*  CREATININE 0.73 0.63 0.75  CALCIUM 8.1* 8.3* 8.9  MG  --   --  2.2   GFR: Estimated Creatinine Clearance: 168 mL/min (by C-G formula based on SCr of 0.75 mg/dL).  Liver Function Tests: Recent Labs  Lab 10/13/19 1459 10/14/19 0318 10/15/19 0058  AST 51* 48* 50*  ALT 31 29 35  ALKPHOS 111  117 133*  BILITOT 1.0 0.9 0.9  PROT 7.8 7.1 7.0  ALBUMIN 3.1* 2.8* 2.9*    HbA1C: Hgb A1c MFr Bld  Date/Time Value Ref Range Status  10/13/2019 09:50 PM 15.6 (H) 4.8 - 5.6 % Final    Comment:    (NOTE) Pre diabetes:          5.7%-6.4% Diabetes:              >6.4% Glycemic control for   <7.0% adults with diabetes   10/13/2019 05:51 PM 15.5 (H) 4.8 - 5.6 % Final    Comment:    (NOTE)         Prediabetes: 5.7 - 6.4         Diabetes: >6.4         Glycemic control for adults with diabetes: <7.0     CBG: Recent Labs  Lab  10/14/19 0805 10/14/19 1117 10/14/19 1647 10/14/19 2020 10/15/19 0812  GLUCAP 273* 336* 551* 469* 354*    Recent Results (from the past 240 hour(s))  Novel Coronavirus, NAA ([facility])     Status: Abnormal   Collection Time: 10/08/19 12:00 AM   Specimen: Oropharyngeal(OP) collection in vial transport medium   OROPHARYNGEA  TESTING  Result Value Ref Range Status   SARS-CoV-2, NAA Detected (A) Not Detected Final    Comment: This nucleic acid amplification test was developed and its performance characteristics determined by [facility]. Nucleic acid amplification tests include PCR and TMA. This test has not been FDA cleared or approved. This test has been authorized by FDA under an Emergency Use Authorization (EUA). This test is only authorized for the duration of time the declaration that circumstances exist justifying the authorization of the emergency use of in vitro diagnostic tests for detection of SARS-CoV-2 virus and/or diagnosis of COVID-19 infection under section 564(b)(1) of the Act, 21 U.S.C. 409WJX-9(J360bbb-3(b) (1), unless the authorization is terminated or revoked sooner. When diagnostic testing is negative, the possibility of a false negative result should be considered in the context of a patient's recent exposures and the presence of clinical signs and symptoms consistent with COVID-19. An individual without symptoms of COVID-19 and who is not shedding SARS-CoV-2 virus would  expect to have a negative (not detected) result in this assay.   Blood Culture (routine x 2)     Status: None (Preliminary result)   Collection Time: 10/13/19  2:59 PM   Specimen: BLOOD  Result Value Ref Range Status   Specimen Description BLOOD BLOOD RIGHT WRIST  Final   Special Requests   Final    BOTTLES DRAWN AEROBIC AND ANAEROBIC Blood Culture adequate volume   Culture   Final    NO GROWTH 2 DAYS Performed at [facility], [address] [place], [place] [address]     Report Status PENDING  Incomplete  Blood Culture (routine x 2)     Status: None (Preliminary result)   Collection Time: 10/13/19  2:59 PM   Specimen: BLOOD  Result Value Ref Range Status   Specimen Description BLOOD LEFT ANTECUBITAL  Final   Special Requests   Final    BOTTLES DRAWN AEROBIC AND ANAEROBIC Blood Culture adequate volume   Culture   Final    NO GROWTH 2 DAYS Performed at [facility], [address] [place], [place] [address]    Report Status PENDING  Incomplete     Scheduled Meds: . vitamin C  500 mg Oral Daily  . dextromethorphan-guaiFENesin  1 tablet  Oral BID  . enoxaparin (LOVENOX) injection  0.5 mg/kg Subcutaneous Q12H  . famotidine  20 mg Oral Daily  . insulin aspart  0-6 Units Subcutaneous TID WC  . insulin aspart  8 Units Subcutaneous TID WC  . insulin detemir  20 Units Subcutaneous BID  . insulin detemir  8 Units Subcutaneous Once  . methylPREDNISolone (SOLU-MEDROL) injection  60 mg Intravenous Q12H  . sodium chloride flush  3 mL Intravenous Q12H  . zinc sulfate  220 mg Oral Daily   Continuous Infusions: . sodium chloride    . remdesivir 100 mg in NS 100 mL 100 mg (10/15/19 1043)     LOS: 2 days   Lonia Blood, MD [facility] Office  [phone] Pager - Text Page per Amion  If 7PM-7AM, please contact night-coverage per Amion 10/15/2019, 12:08 PM

## 2019-10-16 LAB — CBC WITH DIFFERENTIAL/PLATELET
Abs Immature Granulocytes: 0.27 10*3/uL — ABNORMAL HIGH (ref 0.00–0.07)
Basophils Absolute: 0.1 10*3/uL (ref 0.0–0.1)
Basophils Relative: 0 %
Eosinophils Absolute: 0 10*3/uL (ref 0.0–0.5)
Eosinophils Relative: 0 %
HCT: 41.9 % (ref 39.0–52.0)
Hemoglobin: 13.7 g/dL (ref 13.0–17.0)
Immature Granulocytes: 2 %
Lymphocytes Relative: 13 %
Lymphs Abs: 2.1 10*3/uL (ref 0.7–4.0)
MCH: 27.9 pg (ref 26.0–34.0)
MCHC: 32.7 g/dL (ref 30.0–36.0)
MCV: 85.3 fL (ref 80.0–100.0)
Monocytes Absolute: 0.9 10*3/uL (ref 0.1–1.0)
Monocytes Relative: 6 %
Neutro Abs: 12.7 10*3/uL — ABNORMAL HIGH (ref 1.7–7.7)
Neutrophils Relative %: 79 %
Platelets: 399 10*3/uL (ref 150–400)
RBC: 4.91 MIL/uL (ref 4.22–5.81)
RDW: 12 % (ref 11.5–15.5)
WBC: 16.1 10*3/uL — ABNORMAL HIGH (ref 4.0–10.5)
nRBC: 0 % (ref 0.0–0.2)

## 2019-10-16 LAB — GLUCOSE, CAPILLARY
Glucose-Capillary: 361 mg/dL — ABNORMAL HIGH (ref 70–99)
Glucose-Capillary: 334 mg/dL — ABNORMAL HIGH (ref 70–99)
Glucose-Capillary: 384 mg/dL — ABNORMAL HIGH (ref 70–99)
Glucose-Capillary: 410 mg/dL — ABNORMAL HIGH (ref 70–99)
Glucose-Capillary: 436 mg/dL — ABNORMAL HIGH (ref 70–99)
Glucose-Capillary: 369 mg/dL — ABNORMAL HIGH (ref 70–99)

## 2019-10-16 LAB — COMPREHENSIVE METABOLIC PANEL
ALT: 30 U/L (ref 0–44)
AST: 36 U/L (ref 15–41)
Albumin: 2.9 g/dL — ABNORMAL LOW (ref 3.5–5.0)
Alkaline Phosphatase: 140 U/L — ABNORMAL HIGH (ref 38–126)
Anion gap: 12 (ref 5–15)
BUN: 26 mg/dL — ABNORMAL HIGH (ref 6–20)
CO2: 23 mmol/L (ref 22–32)
Calcium: 8.8 mg/dL — ABNORMAL LOW (ref 8.9–10.3)
Chloride: 97 mmol/L — ABNORMAL LOW (ref 98–111)
Creatinine, Ser: 0.64 mg/dL (ref 0.61–1.24)
GFR calc Af Amer: >60 mL/min (ref >60)
GFR calc non Af Amer: >60 mL/min (ref >60)
Glucose, Bld: 333 mg/dL — ABNORMAL HIGH (ref 70–99)
Potassium: 4.5 mmol/L (ref 3.5–5.1)
Sodium: 132 mmol/L — ABNORMAL LOW (ref 135–145)
Total Bilirubin: 0.7 mg/dL (ref 0.3–1.2)
Total Protein: 7 g/dL (ref 6.5–8.1)

## 2019-10-16 LAB — FERRITIN: Ferritin: 695 ng/mL — ABNORMAL HIGH (ref 24–336)

## 2019-10-16 LAB — PROCALCITONIN: Procalcitonin: <0.1 ng/mL

## 2019-10-16 LAB — D-DIMER, QUANTITATIVE: D-Dimer, Quant: 3.8 ug/mL-FEU — ABNORMAL HIGH (ref 0.00–0.50)

## 2019-10-16 LAB — C-REACTIVE PROTEIN: CRP: 11.1 mg/dL — ABNORMAL HIGH (ref <1.0)

## 2019-10-16 MED ORDER — INSULIN ASPART 100 UNIT/ML ~~LOC~~ SOLN
18.0000 [IU] | Freq: Once | SUBCUTANEOUS | Status: AC
  Administered 2019-10-16: 18 [IU] via SUBCUTANEOUS

## 2019-10-16 MED ORDER — INSULIN DETEMIR 100 UNIT/ML ~~LOC~~ SOLN
30.0000 [IU] | Freq: Two times a day (BID) | SUBCUTANEOUS | Status: DC
  Administered 2019-10-16: 30 [IU] via SUBCUTANEOUS
  Filled 2019-10-16 (×2): qty 0.3

## 2019-10-16 MED ORDER — INSULIN DETEMIR 100 UNIT/ML ~~LOC~~ SOLN
36.0000 [IU] | Freq: Two times a day (BID) | SUBCUTANEOUS | Status: DC
  Administered 2019-10-16 – 2019-10-20 (×9): 36 [IU] via SUBCUTANEOUS
  Filled 2019-10-16 (×10): qty 0.36

## 2019-10-16 MED ORDER — INSULIN ASPART 100 UNIT/ML ~~LOC~~ SOLN
14.0000 [IU] | Freq: Three times a day (TID) | SUBCUTANEOUS | Status: DC
  Administered 2019-10-17 – 2019-10-21 (×13): 14 [IU] via SUBCUTANEOUS

## 2019-10-16 NOTE — Progress Notes (Signed)
Problems with getting patient's armband to scan.  Have reprinted armband multiple times--the barcode is printing outside of the margins on paper.  Charge RN has been notified and IT consulted.  Having to override scanning for medication administration until resolved.

## 2019-10-16 NOTE — Progress Notes (Signed)
PHYSICAL THERAPY EVALUATION History of Illness: 28 y.o. male with no previous past medical history presented to the emergency department with worsening shortness of breath and cough. He tested positive for Covid 12/09.  Noted to be saturating in the 60s on room air.  He was placed on high flow nasal cannula at 15 L/min.  He was given remdesivir and dexamethasone.  Transferred to [facility].    Clinical Impression: Pt admitted with above diagnosis. PTA lives home with family was very independent and working in Architect. Pt currently with functional limitations due to the deficits listed below (see PT Problem List). Thsi am pt does well with mobility and is able to get around room with SBA-mod I, initially pt was on 15L/min via HFNC and also has 15l on NRB sats 99%, removed NRB and left on 15L/m HFNC and sats still in high 90s even with ambulation in room, decreased to 10L/min via HFNC and with ambulation approx 149ft desat to min 86%, needed cues for relaxation and pursed lip breathing but was able to recover to 90s within 57min. Pt will benefit from skilled PT to increase his overall balance and coordination activity tolerance and independence with functional mobility/activities in order to d/c safely home when medically able.       10/16/19 0900  PT Visit Information  Last PT Received On 10/16/19  Assistance Needed +1  History of Present Illness 28 y.o. male with no previous past medical history presented to the emergency department with worsening shortness of breath and cough. He tested positive for Covid 12/09.  Noted to be saturating in the 60s on room air.  He was placed on high flow nasal cannula at 15 L/min.  He was given remdesivir and dexamethasone.  Transferred to [facility].  Precautions  Precautions None  Restrictions  Weight Bearing Restrictions No  Home Living  Family/patient expects to be discharged to: Private residence  Living Arrangements Spouse/significant  other;Children (wife and step daughter, wife in hospital too)  Available Help at Discharge Family  Type of [place] Access Level entry  Home Layout One level  Bathroom Shower/Tub Walk-in shower  [place] None  Prior Function  Level of Independence Independent  Comments works in Engineer, building services No difficulties  Pain Assessment  Pain Assessment Faces  Faces Pain Scale 4  Pain Location in R quad from immobility  Cognition  Arousal/Alertness Awake/alert  Behavior During Therapy WFL for tasks assessed/performed  Overall Cognitive Status Within Functional Limits for tasks assessed  Upper Extremity Assessment  Upper Extremity Assessment Overall WFL for tasks assessed  Lower Extremity Assessment  Lower Extremity Assessment Overall WFL for tasks assessed  Cervical / Trunk Assessment  Cervical / Trunk Assessment Normal  Bed Mobility  Overal bed mobility Modified Independent  General bed mobility comments increased time to complete  Transfers  Overall transfer level Needs assistance  Transfers Sit to/from Stand;Stand Pivot Transfers  Sit to Stand Supervision  Stand pivot transfers Supervision  General transfer comment line management assist and set up  Ambulation/Gait  Ambulation/Gait assistance Supervision  Gait Distance (Feet) 120 Feet  Assistive device None  Gait Pattern/deviations Step-through pattern  General Gait Details initailly on 15L/min via HFNC and also 15L NRB and sats in 99 range, ambulated approx 47ft and sats remained in 90s, resuced pt to 15L/min via HFNC only and ambulated 130ft and sats remained in low 90s, reduced pt to  10L/min and ambulated 51ft again and desat to min 86% with this.  Balance  Overall balance assessment Mild deficits observed, not formally tested  Exercises  Exercises Other exercises  Other Exercises  Other Exercises initiated on  Flutter valve and incentive spirometer use  PT - End of Session  Equipment Utilized During Treatment Oxygen  Activity Tolerance Patient tolerated treatment well  Patient left in chair;with call bell/phone within reach  PT Assessment  PT Recommendation/Assessment Patient needs continued PT services  PT Visit Diagnosis Other abnormalities of gait and mobility (R26.89)  PT Problem List Decreased activity tolerance;Decreased balance;Decreased mobility;Decreased coordination;Decreased safety awareness  Barriers to Discharge Comments lives home with spouse and daughetr, spouse hospitalized currently with COVID  PT Plan  PT Frequency (ACUTE ONLY) Min 3X/week  AM-PAC PT "6 Clicks" Mobility Outcome Measure (Version 2)  Help needed turning from your back to your side while in a flat bed without using bedrails? 4  Help needed moving from lying on your back to sitting on the side of a flat bed without using bedrails? 4  Help needed moving to and from a bed to a chair (including a wheelchair)? 3  Help needed standing up from a chair using your arms (e.g., wheelchair or bedside chair)? 3  Help needed to walk in hospital room? 3  Help needed climbing 3-5 steps with a railing?  2  6 Click Score 19  Consider Recommendation of Discharge To: Home with [facility]  PT Recommendation  Recommendations for Other Services OT consult  Follow Up Recommendations No PT follow up  PT equipment None recommended by PT  Individuals Consulted  Consulted and Agree with Results and Recommendations Patient  Acute Rehab PT Goals  Patient Stated Goal go home to spouse  PT Goal Formulation With patient  Time For Goal Achievement 10/30/19  Potential to Achieve Goals Good  PT Time Calculation  PT Start Time (ACUTE ONLY) 8937  PT Stop Time (ACUTE ONLY) 1015  PT Time Calculation (min) (ACUTE ONLY) 47 min  PT General Charges  $$ ACUTE PT VISIT 1 Visit  PT Evaluation  $PT Eval Moderate Complexity 1 Mod  PT Treatments  $Gait  Training 8-22 mins  $Therapeutic Activity 8-22 mins  Written Expression  Dominant Hand Right    Drema Pry, PT

## 2019-10-16 NOTE — Progress Notes (Signed)
Albert Hammond  [id number]:[id number] DOB: 1991/04/08 DOA: 10/13/2019 PCP: Patient, No Pcp Per    Brief Narrative:  28 y.o.malewithno previous past medical history presented to the emergency department with worsening shortness of breath and cough. He tested positive for Covid 5 days prior to admission.  Noted to be saturating in the 60s on room air.  He was placed on high flow nasal cannula at 15 L/min.  He was given remdesivir and dexamethasone.  Transferred to [facility].  Significant Events: 12/14 Admit via [facility] ED  COVID-19 specific Treatment: Remdesivir 12/14 > Actemra 12/15 Steroid  Subjective: Continues to require 15 L high flow nasal cannula support but saturations are in the mid to upper 90s.  Vital signs are otherwise stable.  CBGs remains quite elevated.  Denies any new complaints.  No chest pain nausea vomiting or abdominal pain.  Assessment & Plan:   Acute Hypoxic Resp. Failure -COVID Pneumonia Persistent tenuous respiratory status requiring high amounts of oxygen though he is tolerating it well without marked distress - continue with remdesivir and steroids - CTa at admission did not show PE  Recent Labs  Lab 10/13/19 1459 10/14/19 0318 10/15/19 0058 10/16/19 0055  DDIMER  --  2.81* 3.28* 3.80*  FERRITIN 899*  --  960* 695*  CRP 47.5* 38.3* 21.7* 11.1*  ALT 31 29 35 30  PROCALCITON 0.15  --   --  <0.10    Newly diagnosed DM2 - uncontrolled with severe hyperglycemia did not have a known history of diabetes - HbA1c is 15.6 -continue to adjust insulin therapy as CBGs remain very poorly controlled  Morbid obesity Estimated body mass index is 40.36 kg/m as calculated from the following:   Height as of this encounter: 5\' 7"  (1.702 m).   Weight as of this encounter: 116.9 kg.   DVT prophylaxis: Lovenox Code Status: FULL CODE Family Communication:  Disposition Plan: Progressive care -mobilize  Consultants:  none  Antimicrobials:  None   Objective: Blood pressure 129/75, pulse 86, temperature 98.1 F (36.7 C), temperature source Oral, resp. rate 20, height 5\' 7"  (1.702 m), weight 116.9 kg, SpO2 98 %.  Intake/Output Summary (Last 24 hours) at 10/16/2019 0957 Last data filed at 10/16/2019 0916 Gross per 24 hour  Intake 720 ml  Output 1350 ml  Net -630 ml   Filed Weights   10/14/19 0125  Weight: 116.9 kg    Examination: General: No acute respiratory distress despite high oxygen requirement Lungs: Distant breath sounds to auscultation -diffuse faint crackles persist with no wheezing Cardiovascular: Distant heart sounds but RRR without murmur Abdomen: Obese, soft, BS positive, no rebound Extremities: Trace bilateral lower extremity edema  CBC: Recent Labs  Lab 10/14/19 0318 10/15/19 0058 10/16/19 0055  WBC 7.6 10.7* 16.1*  NEUTROABS 6.0 8.1* 12.7*  HGB 13.7 13.2 13.7  HCT 42.9 41.1 41.9  MCV 88.1 86.5 85.3  PLT 260 356 242   Basic Metabolic Panel: Recent Labs  Lab 10/14/19 0318 10/15/19 0058 10/15/19 1725 10/16/19 0055  NA 138 137  --  132*  K 4.1 4.0  --  4.5  CL 101 99  --  97*  CO2 23 23  --  23  GLUCOSE 308* 414* 408* 333*  BUN 15 25*  --  26*  CREATININE 0.63 0.75  --  0.64  CALCIUM 8.3* 8.9  --  8.8*  MG  --  2.2  --   --    GFR: Estimated Creatinine Clearance: 168 mL/min (by  C-G formula based on SCr of 0.64 mg/dL).  Liver Function Tests: Recent Labs  Lab 10/13/19 1459 10/14/19 0318 10/15/19 0058 10/16/19 0055  AST 51* 48* 50* 36  ALT 31 29 35 30  ALKPHOS 111 117 133* 140*  BILITOT 1.0 0.9 0.9 0.7  PROT 7.8 7.1 7.0 7.0  ALBUMIN 3.1* 2.8* 2.9* 2.9*    HbA1C: Hgb A1c MFr Bld  Date/Time Value Ref Range Status  10/13/2019 09:50 PM 15.6 (H) 4.8 - 5.6 % Final    Comment:    (NOTE) Pre diabetes:          5.7%-6.4% Diabetes:              >6.4% Glycemic control for   <7.0% adults with diabetes   10/13/2019 05:51 PM 15.5 (H) 4.8 - 5.6 % Final    Comment:    (NOTE)          Prediabetes: 5.7 - 6.4         Diabetes: >6.4         Glycemic control for adults with diabetes: <7.0     CBG: Recent Labs  Lab 10/15/19 0812 10/15/19 1157 10/15/19 1613 10/15/19 2010 10/16/19 0818  GLUCAP 354* 422* 448* 409* 361*    Recent Results (from the past 240 hour(s))  Novel Coronavirus, NAA ([facility])     Status: Abnormal   Collection Time: 10/08/19 12:00 AM   Specimen: Oropharyngeal(OP) collection in vial transport medium   OROPHARYNGEA  TESTING  Result Value Ref Range Status   SARS-CoV-2, NAA Detected (A) Not Detected Final    Comment: This nucleic acid amplification test was developed and its performance characteristics determined by [employer]. Nucleic acid amplification tests include PCR and TMA. This test has not been FDA cleared or approved. This test has been authorized by FDA under an Emergency Use Authorization (EUA). This test is only authorized for the duration of time the declaration that circumstances exist justifying the authorization of the emergency use of in vitro diagnostic tests for detection of SARS-CoV-2 virus and/or diagnosis of COVID-19 infection under section 564(b)(1) of the Act, 21 U.S.C. 681EXN-1(Z) (1), unless the authorization is terminated or revoked sooner. When diagnostic testing is negative, the possibility of a false negative result should be considered in the context of a patient's recent exposures and the presence of clinical signs and symptoms consistent with COVID-19. An individual without symptoms of COVID-19 and who is not shedding SARS-CoV-2 virus would  expect to have a negative (not detected) result in this assay.   Blood Culture (routine x 2)     Status: None (Preliminary result)   Collection Time: 10/13/19  2:59 PM   Specimen: BLOOD  Result Value Ref Range Status   Specimen Description BLOOD BLOOD RIGHT WRIST  Final   Special Requests   Final    BOTTLES DRAWN AEROBIC AND ANAEROBIC Blood Culture adequate  volume   Culture   Final    NO GROWTH 3 DAYS Performed at [facility], [address] [place], [place] [address]    Report Status PENDING  Incomplete  Blood Culture (routine x 2)     Status: None (Preliminary result)   Collection Time: 10/13/19  2:59 PM   Specimen: BLOOD  Result Value Ref Range Status   Specimen Description BLOOD LEFT ANTECUBITAL  Final   Special Requests   Final    BOTTLES DRAWN AEROBIC AND ANAEROBIC Blood Culture adequate volume   Culture   Final    NO  GROWTH 3 DAYS Performed at [facility], [address] [place], [place] [address]    Report Status PENDING  Incomplete     Scheduled Meds: . vitamin C  500 mg Oral Daily  . dexamethasone (DECADRON) injection  6 mg Intravenous Q24H  . dextromethorphan-guaiFENesin  1 tablet Oral BID  . enoxaparin (LOVENOX) injection  0.5 mg/kg Subcutaneous Q12H  . famotidine  20 mg Oral Daily  . insulin aspart  0-20 Units Subcutaneous TID WC  . insulin aspart  0-5 Units Subcutaneous QHS  . insulin aspart  10 Units Subcutaneous TID WC  . insulin detemir  20 Units Subcutaneous BID  . zinc sulfate  220 mg Oral Daily   Continuous Infusions: . remdesivir 100 mg in NS 100 mL Stopped (10/15/19 1144)     LOS: 3 days   Lonia BloodJeffrey T. , MD [facility] Office  [phone] Pager - Text Page per Amion  If 7PM-7AM, please contact night-coverage per Amion 10/16/2019, 9:57 AM

## 2019-10-17 LAB — GLUCOSE, CAPILLARY
Glucose-Capillary: 87 mg/dL (ref 70–99)
Glucose-Capillary: 183 mg/dL — ABNORMAL HIGH (ref 70–99)
Glucose-Capillary: 290 mg/dL — ABNORMAL HIGH (ref 70–99)
Glucose-Capillary: 221 mg/dL — ABNORMAL HIGH (ref 70–99)

## 2019-10-17 LAB — CBC WITH DIFFERENTIAL/PLATELET
Abs Immature Granulocytes: 0.4 10*3/uL — ABNORMAL HIGH (ref 0.00–0.07)
Basophils Absolute: 0.1 10*3/uL (ref 0.0–0.1)
Basophils Relative: 1 %
Eosinophils Absolute: 0 10*3/uL (ref 0.0–0.5)
Eosinophils Relative: 0 %
HCT: 45.4 % (ref 39.0–52.0)
Hemoglobin: 14.7 g/dL (ref 13.0–17.0)
Immature Granulocytes: 3 %
Lymphocytes Relative: 26 %
Lymphs Abs: 3.8 10*3/uL (ref 0.7–4.0)
MCH: 27.8 pg (ref 26.0–34.0)
MCHC: 32.4 g/dL (ref 30.0–36.0)
MCV: 85.8 fL (ref 80.0–100.0)
Monocytes Absolute: 0.7 10*3/uL (ref 0.1–1.0)
Monocytes Relative: 5 %
Neutro Abs: 9.5 10*3/uL — ABNORMAL HIGH (ref 1.7–7.7)
Neutrophils Relative %: 65 %
Platelets: 371 10*3/uL (ref 150–400)
RBC: 5.29 MIL/uL (ref 4.22–5.81)
RDW: 11.9 % (ref 11.5–15.5)
WBC: 14.4 10*3/uL — ABNORMAL HIGH (ref 4.0–10.5)
nRBC: 0 % (ref 0.0–0.2)

## 2019-10-17 LAB — COMPREHENSIVE METABOLIC PANEL
ALT: 27 U/L (ref 0–44)
AST: 35 U/L (ref 15–41)
Albumin: 2.8 g/dL — ABNORMAL LOW (ref 3.5–5.0)
Alkaline Phosphatase: 135 U/L — ABNORMAL HIGH (ref 38–126)
Anion gap: 12 (ref 5–15)
BUN: 25 mg/dL — ABNORMAL HIGH (ref 6–20)
CO2: 23 mmol/L (ref 22–32)
Calcium: 8.8 mg/dL — ABNORMAL LOW (ref 8.9–10.3)
Chloride: 100 mmol/L (ref 98–111)
Creatinine, Ser: 0.64 mg/dL (ref 0.61–1.24)
GFR calc Af Amer: >60 mL/min (ref >60)
GFR calc non Af Amer: >60 mL/min (ref >60)
Glucose, Bld: 201 mg/dL — ABNORMAL HIGH (ref 70–99)
Potassium: 3.6 mmol/L (ref 3.5–5.1)
Sodium: 135 mmol/L (ref 135–145)
Total Bilirubin: 0.5 mg/dL (ref 0.3–1.2)
Total Protein: 6.9 g/dL (ref 6.5–8.1)

## 2019-10-17 LAB — FERRITIN: Ferritin: 609 ng/mL — ABNORMAL HIGH (ref 24–336)

## 2019-10-17 LAB — PROCALCITONIN: Procalcitonin: <0.1 ng/mL

## 2019-10-17 LAB — D-DIMER, QUANTITATIVE: D-Dimer, Quant: 6.03 ug/mL-FEU — ABNORMAL HIGH (ref 0.00–0.50)

## 2019-10-17 LAB — C-REACTIVE PROTEIN: CRP: 7.1 mg/dL — ABNORMAL HIGH (ref <1.0)

## 2019-10-17 MED ORDER — SALINE SPRAY 0.65 % NA SOLN
1.0000 | NASAL | Status: DC | PRN
  Filled 2019-10-17: qty 44

## 2019-10-17 NOTE — Progress Notes (Signed)
Patient's spouse updated at bedside on current condition.  No questions at this time.

## 2019-10-17 NOTE — Progress Notes (Signed)
Albert Hammond  JXB:[id number] DOB: 1991/10/25 DOA: 10/13/2019 PCP: Patient, No Pcp Per    Brief Narrative:  28 y.o.malewithno previous past medical history presented to the emergency department with worsening shortness of breath and cough. He tested positive for Covid 5 days prior to admission.  Noted to be saturating in the 60s on room air.  He was placed on high flow nasal cannula at 15 L/min.  He was given remdesivir and dexamethasone.  Transferred to [facility].  Significant Events: 12/14 Admit via [facility] ED  COVID-19 specific Treatment: Remdesivir 12/14 > Actemra 12/15 Steroid  Subjective: Oxygen requirement has decreased somewhat, though the patient is still requiring high level support with 10 L high flow nasal cannula as opposed to 15.  He had his first CBG less than 100 for the past 2 to 3 days this morning. He reports that he feels much better in general. His appetite is improving. He does not feel SOB.  Assessment & Plan:   Acute Hypoxic Resp. Failure -COVID Pneumonia Persistent tenuous respiratory status requiring high amounts of oxygen though he is tolerating it well without marked distress and now beginning to show signs of recovery - continue with remdesivir and steroids - CTa at admission did not show PE  Recent Labs  Lab 10/13/19 1459 10/14/19 0318 10/15/19 0058 10/16/19 0055 10/17/19 0030  DDIMER  --  2.81* 3.28* 3.80* 6.03*  FERRITIN 899*  --  960* 695* 609*  CRP 47.5* 38.3* 21.7* 11.1* 7.1*  ALT 31 29 35 30 27  PROCALCITON 0.15  --   --  <0.10 <0.10    Newly diagnosed DM2 - uncontrolled with severe hyperglycemia did not have a known history of diabetes - HbA1c is 15.6 - follow w/o change in tx today as CBG now much improved   Morbid obesity Estimated body mass index is 40.36 kg/m as calculated from the following:   Height as of this encounter: 5\' 7"  (1.702 m).   Weight as of this encounter: 116.9 kg.   DVT prophylaxis:  Lovenox Code Status: FULL CODE Family Communication:  Disposition Plan: downgrade to med/surg - titrate O2 - follow CBG  Consultants:  none  Antimicrobials:  None  Objective: Blood pressure 123/74, pulse 78, temperature 98.8 F (37.1 C), temperature source Axillary, resp. rate (!) 27, height 5\' 7"  (1.702 m), weight 116.9 kg, SpO2 98 %.  Intake/Output Summary (Last 24 hours) at 10/17/2019 0926 Last data filed at 10/17/2019 0000 Gross per 24 hour  Intake 460 ml  Output 1750 ml  Net -1290 ml   Filed Weights   10/14/19 0125  Weight: 116.9 kg    Examination: General: No acute respiratory distress - able to converse well  Lungs: Distant breath sounds to auscultation - no wheezing  Cardiovascular: RRR - no rub  Abdomen: Obese, soft, BS positive, no rebound Extremities: Trace B LE edema   CBC: Recent Labs  Lab 10/15/19 0058 10/16/19 0055 10/17/19 0030  WBC 10.7* 16.1* 14.4*  NEUTROABS 8.1* 12.7* 9.5*  HGB 13.2 13.7 14.7  HCT 41.1 41.9 45.4  MCV 86.5 85.3 85.8  PLT 356 399 371   Basic Metabolic Panel: Recent Labs  Lab 10/15/19 0058 10/15/19 1725 10/16/19 0055 10/17/19 0030  NA 137  --  132* 135  K 4.0  --  4.5 3.6  CL 99  --  97* 100  CO2 23  --  23 23  GLUCOSE 414* 408* 333* 201*  BUN 25*  --  26*  25*  CREATININE 0.75  --  0.64 0.64  CALCIUM 8.9  --  8.8* 8.8*  MG 2.2  --   --   --    GFR: Estimated Creatinine Clearance: 168 mL/min (by C-G formula based on SCr of 0.64 mg/dL).  Liver Function Tests: Recent Labs  Lab 10/14/19 0318 10/15/19 0058 10/16/19 0055 10/17/19 0030  AST 48* 50* 36 35  ALT 29 35 30 27  ALKPHOS 117 133* 140* 135*  BILITOT 0.9 0.9 0.7 0.5  PROT 7.1 7.0 7.0 6.9  ALBUMIN 2.8* 2.9* 2.9* 2.8*    HbA1C: Hgb A1c MFr Bld  Date/Time Value Ref Range Status  10/13/2019 09:50 PM 15.6 (H) 4.8 - 5.6 % Final    Comment:    (NOTE) Pre diabetes:          5.7%-6.4% Diabetes:              >6.4% Glycemic control for   <7.0% adults  with diabetes   10/13/2019 05:51 PM 15.5 (H) 4.8 - 5.6 % Final    Comment:    (NOTE)         Prediabetes: 5.7 - 6.4         Diabetes: >6.4         Glycemic control for adults with diabetes: <7.0     CBG: Recent Labs  Lab 10/16/19 1110 10/16/19 1709 10/16/19 1749 10/16/19 2014 10/17/19 0734  GLUCAP 384* 436* 410* 369* 87    Recent Results (from the past 240 hour(s))  Novel Coronavirus, NAA ([facility])     Status: Abnormal   Collection Time: 10/08/19 12:00 AM   Specimen: Oropharyngeal(OP) collection in vial transport medium   OROPHARYNGEA  TESTING  Result Value Ref Range Status   SARS-CoV-2, NAA Detected (A) Not Detected Final    Comment: This nucleic acid amplification test was developed and its performance characteristics determined by [employer]. Nucleic acid amplification tests include PCR and TMA. This test has not been FDA cleared or approved. This test has been authorized by FDA under an Emergency Use Authorization (EUA). This test is only authorized for the duration of time the declaration that circumstances exist justifying the authorization of the emergency use of in vitro diagnostic tests for detection of SARS-CoV-2 virus and/or diagnosis of COVID-19 infection under section 564(b)(1) of the Act, 21 U.S.C. 585IDP-8(E) (1), unless the authorization is terminated or revoked sooner. When diagnostic testing is negative, the possibility of a false negative result should be considered in the context of a patient's recent exposures and the presence of clinical signs and symptoms consistent with COVID-19. An individual without symptoms of COVID-19 and who is not shedding SARS-CoV-2 virus would  expect to have a negative (not detected) result in this assay.   Blood Culture (routine x 2)     Status: None (Preliminary result)   Collection Time: 10/13/19  2:59 PM   Specimen: BLOOD  Result Value Ref Range Status   Specimen Description BLOOD BLOOD RIGHT WRIST   Final   Special Requests   Final    BOTTLES DRAWN AEROBIC AND ANAEROBIC Blood Culture adequate volume   Culture   Final    NO GROWTH 4 DAYS Performed at [facility], [address] [place], [place] [address]    Report Status PENDING  Incomplete  Blood Culture (routine x 2)     Status: None (Preliminary result)   Collection Time: 10/13/19  2:59 PM   Specimen: BLOOD  Result Value Ref Range Status  Specimen Description BLOOD LEFT ANTECUBITAL  Final   Special Requests   Final    BOTTLES DRAWN AEROBIC AND ANAEROBIC Blood Culture adequate volume   Culture   Final    NO GROWTH 4 DAYS Performed at [facility], [address] [place], [place] [address]    Report Status PENDING  Incomplete     Scheduled Meds: . vitamin C  500 mg Oral Daily  . dexamethasone (DECADRON) injection  6 mg Intravenous Q24H  . dextromethorphan-guaiFENesin  1 tablet Oral BID  . enoxaparin (LOVENOX) injection  0.5 mg/kg Subcutaneous Q12H  . famotidine  20 mg Oral Daily  . insulin aspart  0-20 Units Subcutaneous TID WC  . insulin aspart  0-5 Units Subcutaneous QHS  . insulin aspart  14 Units Subcutaneous TID WC  . insulin detemir  36 Units Subcutaneous BID  . zinc sulfate  220 mg Oral Daily      LOS: 4 days   Lonia BloodJeffrey T. Tenzin Pavon, MD [facility] Office  [phone] Pager - Text Page per Amion  If 7PM-7AM, please contact night-coverage per Amion 10/17/2019, 9:26 AM

## 2019-10-18 ENCOUNTER — Other Ambulatory Visit: Payer: Self-pay

## 2019-10-18 ENCOUNTER — Inpatient Hospital Stay (HOSPITAL_COMMUNITY): Payer: Self-pay

## 2019-10-18 ENCOUNTER — Inpatient Hospital Stay (HOSPITAL_COMMUNITY): Payer: HRSA Program

## 2019-10-18 DIAGNOSIS — I2699 Other pulmonary embolism without acute cor pulmonale: Secondary | ICD-10-CM

## 2019-10-18 LAB — COMPREHENSIVE METABOLIC PANEL
ALT: 29 U/L (ref 0–44)
AST: 36 U/L (ref 15–41)
Albumin: 2.7 g/dL — ABNORMAL LOW (ref 3.5–5.0)
Alkaline Phosphatase: 111 U/L (ref 38–126)
Anion gap: 11 (ref 5–15)
BUN: 16 mg/dL (ref 6–20)
CO2: 22 mmol/L (ref 22–32)
Calcium: 8.2 mg/dL — ABNORMAL LOW (ref 8.9–10.3)
Chloride: 102 mmol/L (ref 98–111)
Creatinine, Ser: 0.51 mg/dL — ABNORMAL LOW (ref 0.61–1.24)
GFR calc Af Amer: >60 mL/min (ref >60)
GFR calc non Af Amer: >60 mL/min (ref >60)
Glucose, Bld: 137 mg/dL — ABNORMAL HIGH (ref 70–99)
Potassium: 3.6 mmol/L (ref 3.5–5.1)
Sodium: 135 mmol/L (ref 135–145)
Total Bilirubin: 0.7 mg/dL (ref 0.3–1.2)
Total Protein: 5.8 g/dL — ABNORMAL LOW (ref 6.5–8.1)

## 2019-10-18 LAB — CBC WITH DIFFERENTIAL/PLATELET
Abs Immature Granulocytes: 0.28 10*3/uL — ABNORMAL HIGH (ref 0.00–0.07)
Basophils Absolute: 0.1 10*3/uL (ref 0.0–0.1)
Basophils Relative: 0 %
Eosinophils Absolute: 0.2 10*3/uL (ref 0.0–0.5)
Eosinophils Relative: 1 %
HCT: 45 % (ref 39.0–52.0)
Hemoglobin: 15.1 g/dL (ref 13.0–17.0)
Immature Granulocytes: 3 %
Lymphocytes Relative: 24 %
Lymphs Abs: 2.8 10*3/uL (ref 0.7–4.0)
MCH: 28.6 pg (ref 26.0–34.0)
MCHC: 33.6 g/dL (ref 30.0–36.0)
MCV: 85.2 fL (ref 80.0–100.0)
Monocytes Absolute: 0.3 10*3/uL (ref 0.1–1.0)
Monocytes Relative: 3 %
Neutro Abs: 7.7 10*3/uL (ref 1.7–7.7)
Neutrophils Relative %: 69 %
Platelets: 336 10*3/uL (ref 150–400)
RBC: 5.28 MIL/uL (ref 4.22–5.81)
RDW: 12.1 % (ref 11.5–15.5)
WBC: 11.3 10*3/uL — ABNORMAL HIGH (ref 4.0–10.5)
nRBC: 0 % (ref 0.0–0.2)

## 2019-10-18 LAB — C-REACTIVE PROTEIN: CRP: 4.3 mg/dL — ABNORMAL HIGH (ref <1.0)

## 2019-10-18 LAB — GLUCOSE, CAPILLARY
Glucose-Capillary: 131 mg/dL — ABNORMAL HIGH (ref 70–99)
Glucose-Capillary: 228 mg/dL — ABNORMAL HIGH (ref 70–99)
Glucose-Capillary: 226 mg/dL — ABNORMAL HIGH (ref 70–99)
Glucose-Capillary: 258 mg/dL — ABNORMAL HIGH (ref 70–99)

## 2019-10-18 LAB — CULTURE, BLOOD (ROUTINE X 2)
Culture: NO GROWTH
Culture: NO GROWTH
Special Requests: ADEQUATE
Special Requests: ADEQUATE

## 2019-10-18 LAB — D-DIMER, QUANTITATIVE: D-Dimer, Quant: 8.46 ug/mL-FEU — ABNORMAL HIGH (ref 0.00–0.50)

## 2019-10-18 LAB — ECHOCARDIOGRAM COMPLETE
Height: 67 in
Weight: 4123.48 oz

## 2019-10-18 LAB — FERRITIN: Ferritin: 461 ng/mL — ABNORMAL HIGH (ref 24–336)

## 2019-10-18 MED ORDER — ENOXAPARIN SODIUM 80 MG/0.8ML ~~LOC~~ SOLN
70.0000 mg | Freq: Once | SUBCUTANEOUS | Status: AC
  Administered 2019-10-18: 70 mg via SUBCUTANEOUS
  Filled 2019-10-18: qty 0.8

## 2019-10-18 MED ORDER — TRAMADOL HCL 50 MG PO TABS: 50.0000 mg | ORAL_TABLET | Freq: Four times a day (QID) | ORAL | Status: DC | PRN

## 2019-10-18 MED ORDER — ENOXAPARIN SODIUM 120 MG/0.8ML ~~LOC~~ SOLN
115.0000 mg | Freq: Two times a day (BID) | SUBCUTANEOUS | Status: DC
  Administered 2019-10-18 – 2019-10-20 (×5): 115 mg via SUBCUTANEOUS
  Filled 2019-10-18 (×6): qty 0.8

## 2019-10-18 MED ORDER — OXYCODONE HCL 5 MG PO TABS
5.0000 mg | ORAL_TABLET | ORAL | Status: DC | PRN
  Administered 2019-10-18: 09:00:00 10 mg via ORAL
  Filled 2019-10-18: qty 2

## 2019-10-18 MED ORDER — BENZONATATE 100 MG PO CAPS
200.0000 mg | ORAL_CAPSULE | Freq: Three times a day (TID) | ORAL | Status: DC | PRN
  Administered 2019-10-18 – 2019-10-19 (×2): 200 mg via ORAL
  Filled 2019-10-18 (×2): qty 2

## 2019-10-18 NOTE — Progress Notes (Signed)
ANTICOAGULATION CONSULT NOTE - Initial Consult  Pharmacy Consult for Lovenox dosing Indication:  VTE treatment  No Known Allergies  Patient Measurements: Height: 5\' 7"  (170.2 cm) Weight: 257 lb 11.5 oz (116.9 kg) IBW/kg (Calculated) : 66.1 Heparin Dosing Weight: HEPARIN DW (KG): 92.9  Vital Signs: Temp: 99.5 F (37.5 C) (12/18 2040) Temp Source: Oral (12/18 2040) BP: 117/72 (12/19 0200) Pulse Rate: 79 (12/19 0200)  Labs: Recent Labs    10/16/19 0055 10/17/19 0030  HGB 13.7 14.7  HCT 41.9 45.4  PLT 399 371  CREATININE 0.64 0.64    Estimated Creatinine Clearance: 168 mL/min (by C-G formula based on SCr of 0.64 mg/dL).     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 He has been on prophylactic Lovenox since admission for CoVId, but his D-Dimer has  now risen to 6.31mcg/mL-FEU.  His baseline CBC and renal function are WNL.   Goal of Therapy:  Anti-Xa level 0.6-1 units/ml 4hrs after LMWH dose given Monitor platelets by anticoagulation protocol: Yes   Plan:  Give Lovenox 70mg  sub-q x1 dose now, in addition to 45mg  dose given at 2100 on 12/18 Start Lovenox 115mg  sub-q q12h on 10/18/19 at 1100 Monitor CBC, renal function and signs and symptoms of bleeding.  Despina Pole, Pharm. D. Clinical Pharmacist 10/18/2019 2:47 AM

## 2019-10-18 NOTE — Progress Notes (Signed)
Patient had an acute oxygen desaturation event with associated complaints of chest pain and difficulty breathing with some diaphoresis.  Hospitalist and RT notified.  [facility] consulted.  Oxygen delivery increased to include a non-rebreather mask (on top of 15L HFNC).  Chest pain resolved but SOB and DOE continued with RR >35.  Hospitalist at bedside with new CXR, EKG orders and increased Lovenox dose.

## 2019-10-18 NOTE — Progress Notes (Addendum)
Albert Hammond  DEY:[id number] DOB: 1990/12/29 DOA: 10/13/2019 PCP: Patient, No Pcp Per    Brief Narrative:  28 y.o.malewithno previous past medical history presented to the emergency department with worsening shortness of breath and cough. He tested positive for Covid 5 days prior to admission.  Noted to be saturating in the 60s on room air.  He was placed on high flow nasal cannula at 15 L/min.  He was given remdesivir and dexamethasone.  Transferred to [facility].  Significant Events: 12/14 Admit via [facility] ED 12/19 chest pain - acute resp worsening - TTE w/o R heart strain - empiric full anticoag  COVID-19 specific Treatment: Remdesivir 12/14 > 12/18 Actemra 12/15 Solu-Medrol 12/15 > 12/16 Decadron 12/14 + 12/17 >  Subjective: The patient suffered an acute episode early this morning with significant symptomatic oxygen desaturations requiring the addition of a nonrebreather on top of 15 L high flow nasal cannula and chest pain.  Clinical suspicion for PE was high and therefore patient is being treated with full dose Lovenox at this time.  At the time of my visit he is feeling much better.  He denies any recurrent chest pain.  He denies abdominal pain.  He feels the shortness of breath is much improved.  He is in no apparent acute respiratory distress at the time of my exam.  Assessment & Plan:   Acute Hypoxic Resp. Failure -COVID Pneumonia Persistent tenuous respiratory status - continue with remdesivir and steroids - CTa at admission did not show PE - high clinical suspicion for PE last night - B LE venous duplex pending - empiric full anticaog for now   Recent Labs  Lab 10/13/19 1459 10/14/19 0318 10/15/19 0058 10/16/19 0055 10/17/19 0030 10/18/19 0221  DDIMER  --  2.81* 3.28* 3.80* 6.03* 8.46*  FERRITIN 899*  --  960* 695* 609* 461*  CRP 47.5* 38.3* 21.7* 11.1* 7.1* 4.3*  ALT 31 29 35 30 27 29   PROCALCITON 0.15  --   --  <0.10 <0.10  --     Newly  diagnosed DM2 - uncontrolled with severe hyperglycemia did not have a known history of diabetes - HbA1c is 15.6 - CBG improving -continue to monitor trend  Morbid obesity Estimated body mass index is 40.36 kg/m as calculated from the following:   Height as of this encounter: 5\' 7"  (1.702 m).   Weight as of this encounter: 116.9 kg.   DVT prophylaxis: Lovenox Code Status: FULL CODE Family Communication:  Disposition Plan: Progressive care  Consultants:  none  Antimicrobials:  None  Objective: Blood pressure 102/62, pulse 63, temperature 98.3 F (36.8 C), temperature source Axillary, resp. rate (!) 22, height 5\' 7"  (1.702 m), weight 116.9 kg, SpO2 99 %.  Intake/Output Summary (Last 24 hours) at 10/18/2019 0851 Last data filed at 10/17/2019 2300 Gross per 24 hour  Intake 120 ml  Output 500 ml  Net -380 ml   Filed Weights   10/14/19 0125  Weight: 116.9 kg    Examination: General: No acute respiratory distress -laying in the right lateral decubitus position Lungs: Distant breath sounds -fine crackles throughout -no wheezing Cardiovascular: RRR without murmur or rub Abdomen: Obese, soft, BS positive, no rebound Extremities: Trace B LE edema without change  CBC: Recent Labs  Lab 10/16/19 0055 10/17/19 0030 10/18/19 0221  WBC 16.1* 14.4* 11.3*  NEUTROABS 12.7* 9.5* 7.7  HGB 13.7 14.7 15.1  HCT 41.9 45.4 45.0  MCV 85.3 85.8 85.2  PLT 399 371 336  Basic Metabolic Panel: Recent Labs  Lab 10/15/19 0058 10/16/19 0055 10/17/19 0030 10/18/19 0221  NA 137 132* 135 135  K 4.0 4.5 3.6 3.6  CL 99 97* 100 102  CO2 23 23 23 22   GLUCOSE 414* 333* 201* 137*  BUN 25* 26* 25* 16  CREATININE 0.75 0.64 0.64 0.51*  CALCIUM 8.9 8.8* 8.8* 8.2*  MG 2.2  --   --   --    GFR: Estimated Creatinine Clearance: 168 mL/min (A) (by C-G formula based on SCr of 0.51 mg/dL (L)).  Liver Function Tests: Recent Labs  Lab 10/15/19 0058 10/16/19 0055 10/17/19 0030 10/18/19 0221   AST 50* 36 35 36  ALT 35 30 27 29   ALKPHOS 133* 140* 135* 111  BILITOT 0.9 0.7 0.5 0.7  PROT 7.0 7.0 6.9 5.8*  ALBUMIN 2.9* 2.9* 2.8* 2.7*    HbA1C: Hgb A1c MFr Bld  Date/Time Value Ref Range Status  10/13/2019 09:50 PM 15.6 (H) 4.8 - 5.6 % Final    Comment:    (NOTE) Pre diabetes:          5.7%-6.4% Diabetes:              >6.4% Glycemic control for   <7.0% adults with diabetes   10/13/2019 05:51 PM 15.5 (H) 4.8 - 5.6 % Final    Comment:    (NOTE)         Prediabetes: 5.7 - 6.4         Diabetes: >6.4         Glycemic control for adults with diabetes: <7.0     CBG: Recent Labs  Lab 10/17/19 0734 10/17/19 1128 10/17/19 1624 10/17/19 2044 10/18/19 0748  GLUCAP 87 183* 290* 221* 131*    Recent Results (from the past 240 hour(s))  Blood Culture (routine x 2)     Status: None   Collection Time: 10/13/19  2:59 PM   Specimen: BLOOD  Result Value Ref Range Status   Specimen Description BLOOD BLOOD RIGHT WRIST  Final   Special Requests   Final    BOTTLES DRAWN AEROBIC AND ANAEROBIC Blood Culture adequate volume   Culture   Final    NO GROWTH 5 DAYS Performed at [facility], [address] [place], [place] [address]    Report Status 10/18/2019 FINAL  Final  Blood Culture (routine x 2)     Status: None   Collection Time: 10/13/19  2:59 PM   Specimen: BLOOD  Result Value Ref Range Status   Specimen Description BLOOD LEFT ANTECUBITAL  Final   Special Requests   Final    BOTTLES DRAWN AEROBIC AND ANAEROBIC Blood Culture adequate volume   Culture   Final    NO GROWTH 5 DAYS Performed at [facility], [address] [place], [place] [address]    Report Status 10/18/2019 FINAL  Final     Scheduled Meds: . vitamin C  500 mg Oral Daily  . dexamethasone (DECADRON) injection  6 mg Intravenous Q24H  . dextromethorphan-guaiFENesin  1 tablet Oral BID  . enoxaparin (LOVENOX) injection  115 mg Subcutaneous Q12H  . famotidine  20 mg Oral Daily  .  insulin aspart  0-20 Units Subcutaneous TID WC  . insulin aspart  0-5 Units Subcutaneous QHS  . insulin aspart  14 Units Subcutaneous TID WC  . insulin detemir  36 Units Subcutaneous BID  . zinc sulfate  220 mg Oral Daily      LOS: 5 days  Lonia Blood, MD [facility] Office  [phone] Pager - Text Page per Amion  If 7PM-7AM, please contact night-coverage per Amion 10/18/2019, 8:51 AM

## 2019-10-18 NOTE — Progress Notes (Signed)
  Echocardiogram 2D Echocardiogram has been performed.  Albert Hammond 10/18/2019, 2:23 PM

## 2019-10-18 NOTE — Progress Notes (Signed)
PT Cancellation Note  Patient Details Name: Albert Hammond MRN: [id number] DOB: 11/07/1990   Cancelled Treatment:    Reason Eval/Treat Not Completed: Medical issues which prohibited therapy. Attempted to see pt this am but nurse reports had difficult evening/overnight and has not been maintaining sats on 15L/min and also NRB. Nurse recommends hold at this time. Will f/u at a later date pending pt's appropriateness for participation.  Horald Chestnut, PT    Delford Field 10/18/2019, 1:19 PM

## 2019-10-18 NOTE — Progress Notes (Signed)
Patient developed pleuritic chest pain with acute worsening in SOB and oxygenation. The chest pain improved spontaneously prior to assessment but still present when he takes a deep breath.   EKG with non-specific T-wave abnormality that is similar to EKG from ED but HR and QT have now normalized.   CTA chest was negative for PE on 12/14, but d-dimer has risen significantly since then, and while he has been on increased dose of Lovenox, acute PE still a concern.   Plan to check CXR, LE venous US, and increase Lovenox to full treatment dosing for now. Discussed plan with RN.

## 2019-10-18 NOTE — Progress Notes (Signed)
   Vital Signs MEWS/VS Documentation      10/18/2019 0045 10/18/2019 0100 10/18/2019 0145 10/18/2019 0200   MEWS Score:  2  0  0  2   MEWS Score Color:  Yellow  Green  Green  Yellow   Resp:  (!) 27  15  14   (!) 33   Pulse:  85  83  76  79   BP:  119/65  --  122/75  117/72           Ihor Dow Simrill-Hester 10/18/2019,2:21 AM

## 2019-10-19 ENCOUNTER — Inpatient Hospital Stay (HOSPITAL_COMMUNITY): Payer: HRSA Program

## 2019-10-19 DIAGNOSIS — U071 COVID-19: Secondary | ICD-10-CM

## 2019-10-19 DIAGNOSIS — R7989 Other specified abnormal findings of blood chemistry: Secondary | ICD-10-CM

## 2019-10-19 LAB — CBC WITH DIFFERENTIAL/PLATELET
Abs Immature Granulocytes: 0.36 10*3/uL — ABNORMAL HIGH (ref 0.00–0.07)
Basophils Absolute: 0.1 10*3/uL (ref 0.0–0.1)
Basophils Relative: 1 %
Eosinophils Absolute: 0.4 10*3/uL (ref 0.0–0.5)
Eosinophils Relative: 3 %
HCT: 47.1 % (ref 39.0–52.0)
Hemoglobin: 15.8 g/dL (ref 13.0–17.0)
Immature Granulocytes: 3 %
Lymphocytes Relative: 25 %
Lymphs Abs: 3.2 10*3/uL (ref 0.7–4.0)
MCH: 28.3 pg (ref 26.0–34.0)
MCHC: 33.5 g/dL (ref 30.0–36.0)
MCV: 84.4 fL (ref 80.0–100.0)
Monocytes Absolute: 0.6 10*3/uL (ref 0.1–1.0)
Monocytes Relative: 4 %
Neutro Abs: 8.1 10*3/uL — ABNORMAL HIGH (ref 1.7–7.7)
Neutrophils Relative %: 64 %
Platelets: 370 10*3/uL (ref 150–400)
RBC: 5.58 MIL/uL (ref 4.22–5.81)
RDW: 12.1 % (ref 11.5–15.5)
WBC: 12.6 10*3/uL — ABNORMAL HIGH (ref 4.0–10.5)
nRBC: 0 % (ref 0.0–0.2)

## 2019-10-19 LAB — GLUCOSE, CAPILLARY
Glucose-Capillary: 111 mg/dL — ABNORMAL HIGH (ref 70–99)
Glucose-Capillary: 164 mg/dL — ABNORMAL HIGH (ref 70–99)
Glucose-Capillary: 189 mg/dL — ABNORMAL HIGH (ref 70–99)
Glucose-Capillary: 176 mg/dL — ABNORMAL HIGH (ref 70–99)

## 2019-10-19 LAB — FERRITIN: Ferritin: 416 ng/mL — ABNORMAL HIGH (ref 24–336)

## 2019-10-19 LAB — COMPREHENSIVE METABOLIC PANEL
ALT: 41 U/L (ref 0–44)
AST: 39 U/L (ref 15–41)
Albumin: 2.9 g/dL — ABNORMAL LOW (ref 3.5–5.0)
Alkaline Phosphatase: 105 U/L (ref 38–126)
Anion gap: 11 (ref 5–15)
BUN: 16 mg/dL (ref 6–20)
CO2: 25 mmol/L (ref 22–32)
Calcium: 9.1 mg/dL (ref 8.9–10.3)
Chloride: 101 mmol/L (ref 98–111)
Creatinine, Ser: 0.59 mg/dL — ABNORMAL LOW (ref 0.61–1.24)
GFR calc Af Amer: >60 mL/min (ref >60)
GFR calc non Af Amer: >60 mL/min (ref >60)
Glucose, Bld: 138 mg/dL — ABNORMAL HIGH (ref 70–99)
Potassium: 3.7 mmol/L (ref 3.5–5.1)
Sodium: 137 mmol/L (ref 135–145)
Total Bilirubin: 0.7 mg/dL (ref 0.3–1.2)
Total Protein: 6.3 g/dL — ABNORMAL LOW (ref 6.5–8.1)

## 2019-10-19 LAB — C-REACTIVE PROTEIN: CRP: 1.2 mg/dL — ABNORMAL HIGH (ref <1.0)

## 2019-10-19 LAB — D-DIMER, QUANTITATIVE: D-Dimer, Quant: 8.12 ug/mL-FEU — ABNORMAL HIGH (ref 0.00–0.50)

## 2019-10-19 NOTE — Progress Notes (Signed)
Albert Hammond  JJO:[id number] DOB: 07/09/1991 DOA: 10/13/2019 PCP: Patient, No Pcp Per    Brief Narrative:  28 y.o.malewithno previous past medical history presented to the emergency department with worsening shortness of breath and cough. He tested positive for Covid 5 days prior to admission.  Noted to be saturating in the 60s on room air.  He was placed on high flow nasal cannula at 15 L/min.  He was given remdesivir and dexamethasone.  Transferred to [facility].  Significant Events: 12/14 Admit via [facility] ED 12/19 chest pain - acute resp worsening - TTE w/o R heart strain - empiric full anticoag 12/20 venous duplex + DVT in posterior tibial and peroneal veins and probable early DVT B popliteal veins   COVID-19 specific Treatment: Remdesivir 12/14 > 12/18 Actemra 12/15 Solu-Medrol 12/15 > 12/16 Decadron 12/14 + 12/17 >  Subjective: Venous duplex confirms LE DVT.  The patient is resting comfortably in bed.  He tells me he feels much better and is in fact asking when he can go home.  He denies any chest pain.  He states he feels much less short of breath.  No nausea vomiting or abdominal pain.  Assessment & Plan:   Acute Hypoxic Resp. Failure -COVID Pneumonia Continues to require high level oxygen support but respiratory status has stabilized greatly with patient in no distress -has completed courses of remdesivir and Actemra -continue steroid therapy for a full 10-day course  Recent Labs  Lab 10/13/19 1459 10/15/19 0058 10/16/19 0055 10/17/19 0030 10/18/19 0221 10/19/19 0450  DDIMER  --  3.28* 3.80* 6.03* 8.46* 8.12*  FERRITIN 899* 960* 695* 609* 461* 416*  CRP 47.5* 21.7* 11.1* 7.1* 4.3* 1.2*  ALT 31 35 30 27 29  41  PROCALCITON 0.15  --  <0.10 <0.10  --   --     L LE DVT - clinically suspected Acute PE  CTa at admission did not show PE - high clinical suspicion for PE occurring acutely night of 12/18 -left lower extremity DVT now confirmed via venous  duplex -empiric anticoagulation initiated 12/19  Newly diagnosed DM2 - uncontrolled with severe hyperglycemia did not have a known history of diabetes - HbA1c is 15.6 -required many days of frequent insulin titration but CBG is now within target range  Morbid obesity Estimated body mass index is 40.36 kg/m as calculated from the following:   Height as of this encounter: 5\' 7"  (1.702 m).   Weight as of this encounter: 116.9 kg.   DVT prophylaxis: Lovenox full dose Code Status: FULL CODE Family Communication: Spoke with wife at bedside (a patient at [facility]) Disposition Plan: Progressive care  Consultants:  none  Antimicrobials:  None  Objective: Blood pressure 101/60, pulse 77, temperature 99 F (37.2 C), temperature source Oral, resp. rate (!) 23, height 5\' 7"  (1.702 m), weight 116.9 kg, SpO2 92 %.  Intake/Output Summary (Last 24 hours) at 10/19/2019 1035 Last data filed at 10/19/2019 0400 Gross per 24 hour  Intake 840 ml  Output 2550 ml  Net -1710 ml   Filed Weights   10/14/19 0125  Weight: 116.9 kg    Examination: General: No acute respiratory distress -alert and conversant Lungs: Distant breath sounds -fine crackles throughout with no significant change Cardiovascular: RRR  Abdomen: Obese, soft, BS positive, no rebound Extremities: Trace B LE edema which is stable  CBC: Recent Labs  Lab 10/17/19 0030 10/18/19 0221 10/19/19 0450  WBC 14.4* 11.3* 12.6*  NEUTROABS 9.5* 7.7 8.1*  HGB  14.7 15.1 15.8  HCT 45.4 45.0 47.1  MCV 85.8 85.2 84.4  PLT 371 336 370   Basic Metabolic Panel: Recent Labs  Lab 10/15/19 0058 10/15/19 1725 10/17/19 0030 10/18/19 0221 10/19/19 0450  NA 137   < > 135 135 137  K 4.0   < > 3.6 3.6 3.7  CL 99   < > 100 102 101  CO2 23   < > 23 22 25   GLUCOSE 414*  --  201* 137* 138*  BUN 25*   < > 25* 16 16  CREATININE 0.75   < > 0.64 0.51* 0.59*  CALCIUM 8.9   < > 8.8* 8.2* 9.1  MG 2.2  --   --   --   --    < > = values in this  interval not displayed.   GFR: Estimated Creatinine Clearance: 168 mL/min (A) (by C-G formula based on SCr of 0.59 mg/dL (L)).  Liver Function Tests: Recent Labs  Lab 10/16/19 0055 10/17/19 0030 10/18/19 0221 10/19/19 0450  AST 36 35 36 39  ALT 30 27 29  41  ALKPHOS 140* 135* 111 105  BILITOT 0.7 0.5 0.7 0.7  PROT 7.0 6.9 5.8* 6.3*  ALBUMIN 2.9* 2.8* 2.7* 2.9*    HbA1C: Hgb A1c MFr Bld  Date/Time Value Ref Range Status  10/13/2019 09:50 PM 15.6 (H) 4.8 - 5.6 % Final    Comment:    (NOTE) Pre diabetes:          5.7%-6.4% Diabetes:              >6.4% Glycemic control for   <7.0% adults with diabetes   10/13/2019 05:51 PM 15.5 (H) 4.8 - 5.6 % Final    Comment:    (NOTE)         Prediabetes: 5.7 - 6.4         Diabetes: >6.4         Glycemic control for adults with diabetes: <7.0     CBG: Recent Labs  Lab 10/18/19 0748 10/18/19 1059 10/18/19 1632 10/18/19 2141 10/19/19 0809  GLUCAP 131* 228* 226* 258* 111*    Recent Results (from the past 240 hour(s))  Blood Culture (routine x 2)     Status: None   Collection Time: 10/13/19  2:59 PM   Specimen: BLOOD  Result Value Ref Range Status   Specimen Description BLOOD BLOOD RIGHT WRIST  Final   Special Requests   Final    BOTTLES DRAWN AEROBIC AND ANAEROBIC Blood Culture adequate volume   Culture   Final    NO GROWTH 5 DAYS Performed at [facility], [address] [place], [place] [address]    Report Status 10/18/2019 FINAL  Final  Blood Culture (routine x 2)     Status: None   Collection Time: 10/13/19  2:59 PM   Specimen: BLOOD  Result Value Ref Range Status   Specimen Description BLOOD LEFT ANTECUBITAL  Final   Special Requests   Final    BOTTLES DRAWN AEROBIC AND ANAEROBIC Blood Culture adequate volume   Culture   Final    NO GROWTH 5 DAYS Performed at [facility], [address] [place], [place] [address]    Report Status 10/18/2019 FINAL  Final     Scheduled Meds: . vitamin  C  500 mg Oral Daily  . dexamethasone (DECADRON) injection  6 mg Intravenous Q24H  . dextromethorphan-guaiFENesin  1 tablet Oral BID  . enoxaparin (LOVENOX) injection  115 mg Subcutaneous Q12H  . famotidine  20 mg Oral Daily  . insulin aspart  0-20 Units Subcutaneous TID WC  . insulin aspart  0-5 Units Subcutaneous QHS  . insulin aspart  14 Units Subcutaneous TID WC  . insulin detemir  36 Units Subcutaneous BID  . zinc sulfate  220 mg Oral Daily      LOS: 6 days   Cherene Altes, MD [facility] Office  [phone] Pager - Text Page per Amion  If 7PM-7AM, please contact night-coverage per Amion 10/19/2019, 10:35 AM

## 2019-10-20 LAB — COMPREHENSIVE METABOLIC PANEL
ALT: 122 U/L — ABNORMAL HIGH (ref 0–44)
AST: 91 U/L — ABNORMAL HIGH (ref 15–41)
Albumin: 3.1 g/dL — ABNORMAL LOW (ref 3.5–5.0)
Alkaline Phosphatase: 85 U/L (ref 38–126)
Anion gap: 14 (ref 5–15)
BUN: 18 mg/dL (ref 6–20)
CO2: 23 mmol/L (ref 22–32)
Calcium: 8.8 mg/dL — ABNORMAL LOW (ref 8.9–10.3)
Chloride: 101 mmol/L (ref 98–111)
Creatinine, Ser: 0.67 mg/dL (ref 0.61–1.24)
GFR calc Af Amer: >60 mL/min (ref >60)
GFR calc non Af Amer: >60 mL/min (ref >60)
Glucose, Bld: 75 mg/dL (ref 70–99)
Potassium: 3.7 mmol/L (ref 3.5–5.1)
Sodium: 138 mmol/L (ref 135–145)
Total Bilirubin: 1.1 mg/dL (ref 0.3–1.2)
Total Protein: 6.2 g/dL — ABNORMAL LOW (ref 6.5–8.1)

## 2019-10-20 LAB — GLUCOSE, CAPILLARY
Glucose-Capillary: 100 mg/dL — ABNORMAL HIGH (ref 70–99)
Glucose-Capillary: 204 mg/dL — ABNORMAL HIGH (ref 70–99)
Glucose-Capillary: 251 mg/dL — ABNORMAL HIGH (ref 70–99)
Glucose-Capillary: 301 mg/dL — ABNORMAL HIGH (ref 70–99)

## 2019-10-20 LAB — CBC
HCT: 46.7 % (ref 39.0–52.0)
Hemoglobin: 15.7 g/dL (ref 13.0–17.0)
MCH: 28.2 pg (ref 26.0–34.0)
MCHC: 33.6 g/dL (ref 30.0–36.0)
MCV: 83.8 fL (ref 80.0–100.0)
Platelets: 365 10*3/uL (ref 150–400)
RBC: 5.57 MIL/uL (ref 4.22–5.81)
RDW: 12.2 % (ref 11.5–15.5)
WBC: 12 10*3/uL — ABNORMAL HIGH (ref 4.0–10.5)
nRBC: 0 % (ref 0.0–0.2)

## 2019-10-20 MED ORDER — DEXAMETHASONE 6 MG PO TABS
6.0000 mg | ORAL_TABLET | Freq: Every day | ORAL | Status: DC
  Administered 2019-10-21 – 2019-10-22 (×2): 6 mg via ORAL
  Filled 2019-10-20 (×2): qty 1

## 2019-10-20 MED ORDER — APIXABAN 5 MG PO TABS: 5.0000 mg | ORAL_TABLET | Freq: Two times a day (BID) | ORAL | Status: DC

## 2019-10-20 MED ORDER — ACETAMINOPHEN 500 MG PO TABS: 500.0000 mg | ORAL_TABLET | Freq: Four times a day (QID) | ORAL | Status: DC | PRN

## 2019-10-20 MED ORDER — APIXABAN 5 MG PO TABS
10.0000 mg | ORAL_TABLET | Freq: Two times a day (BID) | ORAL | Status: DC
  Administered 2019-10-20 – 2019-10-22 (×4): 10 mg via ORAL
  Filled 2019-10-20 (×4): qty 2

## 2019-10-20 NOTE — Discharge Instructions (Addendum)
COVID-19 COVID-19 La COVID-19 es una infeccin respiratoria causada por un virus llamado coronavirus tipo 2 causante del sndrome respiratorio agudo grave (SARS-CoV-2). La enfermedad tambin se conoce como enfermedad por coronavirus o nuevo coronavirus. En algunas personas, el virus puede no ocasionar sntomas. En otras, puede producir una infeccin grave. La infeccin puede empeorar rpidamente y causar complicaciones, como:  Neumona o infeccin en los pulmones.  Sndrome de dificultad respiratoria aguda o SDRA. Se trata de la acumulacin de lquido en los pulmones.  Insuficiencia respiratoria aguda. Se trata de una afeccin en la que no pasa suficiente oxgeno de los pulmones al cuerpo.  Sepsis o choque sptico. Se trata de una reaccin grave del cuerpo ante una infeccin.  Problemas de coagulacin.  Infecciones secundarias debido a bacterias u hongos. El virus que causa la COVID-19 es contagioso. Esto significa que puede transmitirse de [place] persona a otra a travs de las gotitas de saliva de la tos y de los estornudos (secreciones respiratorias). Cules son las causas? Esta enfermedad es causada por un virus. Usted puede contagiarse con este virus:  Al aspirar las gotitas que una persona infectada elimina al toser o Engineering geologist.  Al tocar algo, como una mesa o el picaportes de [place], que estuvo expuesto al virus (contaminado) y luego tocarse la boca, nariz o los ojos. Qu incrementa el riesgo? Riesgo de infeccin Es ms probable que se infecte con este virus si:  Vive o viaja a una zona donde hay un brote de COVID-19.  Carollee Massed en contacto con una persona enferma que recientemente viaj a una zona con un brote de COVID-19.  Cuida o vive con una persona infectada con COVID-19. Riesgo de enfermedad grave Es ms probable que se enferme gravemente por el virus si:  Tiene 65aos o ms.  Tiene una enfermedad crnica que disminuye la capacidad del cuerpo para combatir  las infecciones (immunocomprometido).  Vive en un hogar de ancianos o centro de atencin a Air cabin crew.  Tiene una enfermedad prolongada (crnica), como las siguientes: ? Enfermedad pulmonar crnica, que incluye la enfermedad pulmonar obstructiva crnica o asma. ? Enfermedad cardaca. ? Diabetes. ? Enfermedad renal crnica. ? Enfermedad heptica.  Es obeso. Cules son los signos o sntomas? Los sntomas de esta afeccin pueden ser de leves a graves. Los sntomas pueden aparecer en el trmino de 2 a [address] despus de haber estado expuesto al virus. Incluyen los siguientes:  [place].  Tos.  Dificultad para respirar.  Escalofros.  Dolores musculares.  Dolor de Advertising copywriter.  Prdida del gusto o Cabin crew. Algunas personas tambin pueden Mattel, como nuseas, vmitos o diarrea. Es posible que otras personas no tengan sntomas de COVID-19. Cmo se diagnostica? Esta afeccin se puede diagnosticar en funcin de lo siguiente:  Sus signos y sntomas, especialmente si: ? Vive en una zona donde hay un brote de COVID-19. ? Viaj recientemente a una zona donde el virus es frecuente. ? Cuida o vive con Neomia Dear persona a quien se le diagnostic COVID-19.  Un examen fsico.  Anlisis de laboratorio que pueden incluir: ? Un hisopado nasal para tomar [place] de lquido de la nariz. ? Un hisopado de garganta para tomar Lauris Poag de lquido de la garganta. ? Una muestra de mucosidad de los pulmones (esputo). ? Anlisis de Warner.  Los estudios de diagnstico por imgenes pueden incluir radiografas, exploracin por tomografa computarizada (TC) o ecografa. Cmo se trata? En este momento, no hay ningn medicamento para tratar la COVID-19.  Los medicamentos para tratar otras enfermedades se usan a modo de ensayo para comprobar si son eficaces contra la COVID-19. El mdico le informar sobre las maneras de tratar los sntomas. En la [employer], la  infeccin es leve y puede controlarse en el hogar con reposo, lquidos y medicamentos de [place]. El tratamiento para una infeccin grave suele realizarse en la unidad de cuidados intensivos (UCI) de un hospital. Puede incluir uno o ms de los siguientes. Estos tratamientos se administran hasta que los sntomas mejoran.  Recibir lquidos y [employer]ne[employer] a travs de una va intravenosa.  Oxgeno complementario. Para administrar oxgeno extra, se [place] un tubo en la Doran Durand, una mascarilla o una campana de oxgeno.  Colocarlo para que se recueste boca abajo (decbito prono). Esto facilita el ingreso de oxgeno a los pulmones.  Uso continuo de [place] de presin positiva de las vas areas (CPAP) o de presin positiva de las vas areas de dos niveles (BPAP). Este tratamiento utiliza una presin de aire leve para Theatre manager las vas respiratorias abiertas. Un tubo conectado a un motor administra oxgeno al cuerpo.  Respirador. Este tratamiento mueve el aire dentro y fuera de los pulmones mediante el uso de un tubo que se coloca en la trquea.  Traqueostoma. En este procedimiento se hace un orificio en el cuello para insertar un tubo de respiracin.  Oxigenacin por membrana extracorprea (OMEC). En este procedimiento, los pulmones tienen la posibilidad de recuperarse al asumir las funciones del corazn y los pulmones. Suministra oxgeno al cuerpo y elimina el dixido de carbono. Siga estas instrucciones en su casa: Estilo de vida  Si est enfermo, qudese en su casa, excepto para obtener atencin mdica. El mdico le indicar cunto tiempo debe quedarse en casa. Llame al mdico antes de buscar atencin mdica.  Haga reposo en su casa como se lo haya indicado el mdico.  No consuma ningn producto que contenga nicotina o tabaco, como cigarrillos, cigarrillos electrnicos y tabaco de Higher education careers adviser. Si necesita ayuda para dejar de fumar, consulte al mdico.  Retome sus actividades normales como se  lo haya indicado el mdico. Pregntele al mdico qu actividades son seguras para usted. Instrucciones generales  Use los medicamentos de venta libre y los recetados solamente como se lo haya indicado el mdico.  Beba suficiente lquido como para Theatre manager la orina de color amarillo plido.  Concurra a todas las visitas de seguimiento como se lo haya indicado el mdico. Esto es importante. Cmo se evita?  No hay ninguna vacuna que ayude a prevenir la infeccin por la COVID-19. Sin embargo, hay medidas que puede tomar para protegerse y Museum/gallery curator a Producer, television/film/video de este virus. Para protegerse:   No viaje a zonas donde la COVID-19 sea un riesgo. Las zonas donde se informa la presencia de la COVID-19 [place] con frecuencia. Para identificar las zonas de alto riesgo y las restricciones de viaje, consulte el sitio web de viajes de Garment/textile technologist for [employer] Noble and Prevention Librarian, academic) (Centros para el Control y la Prevencin de Arboriculturist): FatFares.com.br  Si vive o debe viajar a una zona donde COVID-19 es un riesgo, tome precauciones para evitar infecciones. ? Aljese de [place]. ? Lvese las manos frecuentemente con agua y [place]. Use desinfectante para manos con alcohol si no dispone de [place] y [place]. ? Evite tocarse la boca, la cara, los ojos o la [place]. ? Evite salir de su casa, siga las indicaciones de su estado y [place] autoridades sanitarias  locales. ? Si debe salir de su casa, use un barbijo de tela o una mascarilla facial. ? Desinfecte los objetos y las superficies que se tocan con frecuencia todos [place]. Pueden incluir:  Encimeras y [place].  Picaportes e interruptores de luz.  Lavabos, fregaderos y grifos.  Aparatos electrnicos tales como telfonos, controles remotos, teclados, computadoras y tabletas. Cmo proteger a los dems: Si tiene sntomas de la COVID-19, tome medidas para evitar que el virus se propague a Metallurgist.  Si cree que tiene una infeccin por la COVID-19, comunquese de inmediato con su mdico. Informe al equipo de atencin mdica que cree que puede tener una infeccin por la COVID-19.  Qudese en su casa. Salga de su casa solo para buscar atencin mdica. No utilice el transporte pblico.  No viaje mientras est enfermo.  Lvese las manos frecuentemente con agua y [place]. Usar desinfectante para manos con alcohol si no dispone de [place] y [place].  Mantngase alejado de quienes vivan con usted. Permita que los miembros de la familia sanos cuiden a los nios y las [place], si es posible. Si tiene que cuidar a los nios o las mascotas, lvese las manos con frecuencia y use un barbijo. Si es posible, permanezca en su habitacin, separado de los dems. Utilice un bao diferente.  Asegrese de que todas las personas que viven en su casa se laven bien las manos y con frecuencia.  Tosa o estornude en un pauelo de papel o sobre su manga o codo. No tosa o estornude al aire ni se cubra la boca o la nariz con la [place].  Use un barbijo de tela o una mascarilla facial. Dnde buscar ms informacin  Centers for Disease Control and Prevention (Centros para el Control y la Prevencin de Event organiser): StickerEmporium.tn  World Health Organization (Organizacin Mundial de la Salud): [email or web address]/ Comunquese con un mdico si:  Vive o ha viajado a una zona donde la COVID-19 es un riesgo y tiene sntomas de infeccin.  Ha tenido contacto con alguien que tiene COVID-19 y usted tiene sntomas de infeccin. Solicite ayuda de inmediato si:  Tiene dificultad para respirar.  Siente dolor u opresin en el pecho.  Experimenta confusin.  Tiene las uas de los dedos y los labios de color [place].  Tiene dificultad para despertarse.  Los sntomas empeoran. Estos sntomas pueden representar un problema grave que constituye Research scientist (physical sciences). No espere a ver si los sntomas desaparecen. Solicite atencin mdica de inmediato. Comunquese con el servicio de emergencias de su localidad (911 en los [place]). No conduzca por sus propios medios [employer] hospital. Informe al personal mdico de emergencias si cree que tiene COVID-19. Resumen  La COVID-19 es una infeccin respiratoria causada por un virus. Tambin se conoce como enfermedad por coronavirus o nuevo coronavirus. Puede causar infecciones graves, como neumona, sndrome de dificultad respiratoria aguda, insuficiencia respiratoria aguda o sepsis.  El virus que causa la COVID-19 es contagioso. Esto significa que puede transmitirse de [place] persona a otra a travs de las gotitas de saliva de la tos y de los estornudos.  Es ms probable que desarrolle una enfermedad grave si tiene 65 aos o ms, tiene un sistema inmunitario dbil, vive en un hogar de ancianos o tiene enfermedad crnica.  No hay ningn medicamento para tratar la COVID-19. El mdico le informar sobre las maneras de tratar los sntomas.  Tome medidas para protegerse y Conservator, museum/gallery a los Merchandiser, retail las infecciones. Lvese las manos con frecuencia y  desinfecte los objetos y las superficies que se tocan con frecuencia todos Winnebagolos das. Mantngase alejado de las personas que estn enfermas y use un barbijo si est enfermo. Esta informacin no tiene Theme park managercomo fin reemplazar el consejo del mdico. Asegrese de hacerle al mdico cualquier pregunta que tenga. Document Released: 12/14/2018 Document Revised: 03/18/2019 Document Reviewed: 12/14/2018 Elsevier Patient Education  2020 Elsevier Inc.     Preguntas frecuentes sobre el COVID-19 COVID-19 Frequently Asked Questions El COVID-19 (enfermedad por coronavirus) es una infeccin causada por una gran familia de virus. Algunos virus causan National Cityenfermedades en las personas y otros causan enfermedades en animales tales como los camellos, los gatos y los murcilagos. En algunos  casos, los virus que causan New York Life Insuranceenfermedades en los animales pueden transmitirse a los seres humanos. De dnde provino el coronavirus? En diciembre de 2019, Armeniahina le inform a la Organizacin Mundial de la Salud (OMS) acerca de varios casos de enfermedad pulmonar (enfermedad respiratoria humana). Estos casos estaban vinculados con un mercado abierto de frutos de mar y [place]ado en la ciudad de [place]. El vnculo con el mercado de ganado y Liberty Globalmariscos sugiere que el virus puede haberse propagado de los animales a los Hiramhumanos. Sin embargo, desde Chiropodistese primer brote en diciembre, tambin se ha demostrado que el virus se contagia de [place] persona a Educational psychologistotra. Cul es el nombre de la enfermedad y del virus? Nombre de la enfermedad Al principio, esta enfermedad se llam nuevo coronavirus. Esto se debe a que los cientficos determinaron que la enfermedad era causada por un nuevo virus respiratorio. La Organizacin Mundial de la Salud (OMS) ahora ha dado a la enfermedad el nombre de COVID-19, o enfermedad por coronavirus. Nombre del virus El virus causante de la enfermedad se conoce como coronavirus de tipo 2 causante del sndrome respiratorio agudo grave (SARS-CoV-2). Ms informacin sobre el nombre de la enfermedad y el virus Organizacin Mundial de la Raymond CitySalud (OMS): www.who.int/emergencies/diseases/novel-coronavirus-2019/technical-guidance/naming-the-coronavirus-disease-(covid-2019)-and-the-virus-that-causes-it Quines estn en riesgo de sufrir complicaciones debido a la enfermedad por coronavirus? Algunas personas pueden tener un riesgo ms alto de tener complicaciones debido a la enfermedad por coronavirus. Entre ellas se encuentran los ONEOKadultos mayores y las personas que tienen enfermedades crnicas, como enfermedad cardaca, diabetes y enfermedad pulmonar. Si tiene un riesgo ms alto de Sales executivetener complicaciones, tome estas precauciones adicionales:  Recruitment consultantvitar el contacto cercano con personas que estn enfermas o que tengan fiebre  o tos. Permanecer al menos a una distancia de 3 a 6 pies ([id number]) de las Nucor Corporationotras personas, si es posible.  Lavarse las manos regularmente con agua y jabn durante al menos 20segundos.  Evitar tocarse la cara, la boca, la nariz y los ojos.  Tener a H. J. Heinzmano los suministros en su casa, como alimentos, medicamentos y productos de limpieza.  Permanecer en su casa todo lo que sea posible.  Evitar las reuniones sociales y los viajes. Cmo se transmite la enfermedad causada por el coronavirus? El virus que causa la enfermedad por coronavirus se transmite fcilmente de Neomia Dearuna persona a otra (es contagioso). Tambin hay casos de enfermedad de transmisin comunitaria. Esto significa que la enfermedad se ha propagado a:  Personas que no tienen contacto conocido con Pharmacist, communityotras personas infectadas.  Personas que no han viajado a zonas donde hay casos conocidos. Aparentemente, se transmite de [place]a persona a otra a travs de las YUM! Brandsgotitas que se despiden al toser o al estornudar. Puedo contraer al virus al tocar superficies u objetos? Todava hay mucho que no se conoce acerca del virus que causa la enfermedad por coronavirus.  Los cientficos basan gran parte de la informacin en lo que saben sobre virus similares, por ejemplo:  En general, los virus no sobreviven en superficies durante mucho tiempo. Necesitan un cuerpo humano (husped) para sobrevivir.  Es ms probable que el virus se contagie por contacto cercano con personas que estn enfermas (contacto directo), por ejemplo: ? Al estrechar las manos o abrazarse. ? Al inhalar las gotitas respiratorias que se desplazan por el aire. Esto puede ocurrir cuando una persona infectada tose o estornuda sobre o cerca de otras personas.  Es menos probable que el virus se propague cuando una persona toca una superEconomistobjeto sobre el que est el virus (contacto indirecto). El virus puede ingresar al cuerpo si la persona toca una superficie o un objeto y [employer]s se toca la  cara, los ojos, la nariz o la boca. Una persona puede contagiar el virus sin tener sntomas de la enfermedad? Puede ser posible que el virus se contagie antes de que la persona tenga sntomas de la enfermedad, pero muy probablemente esta no sea la principal forma en que el virus se est propagando. Es ms probable que el virus se propague al estar en contacto directo con personas que estn enfermas e inhalar las gotas respiratorias que una persona enferma despide al toser o Engineering geologist. Cules son los sntomas de la enfermedad causada por el coronavirus? Los sntomas varan de Neomia Dear persona a otra y pueden variar de leves a graves. Hers[employer], se pueden incluir los siguientes:  [place].  Tos.  Cansancio, debilidad o fatiga.  Respiracin rpida o sensacin de falta el aire. Estos sntomas pueden aparecer en el trmino de 2 a [address] despus de haber estado expuesto al virus. Si presenta sntomas, llame al mdico. Las personas con sntomas graves pueden necesitar atencin hospitalaria. Si estoy expuesto al virus, cunto tiempo tardan en aparecer los sntomas? Los sntomas de la enfermedad por coronavirus Magazine features editor en el trmino de 2 a 14 das despus de que una persona haya estado expuesta al virus. Si presenta sntomas, llame al mdico. Debo hacerme un anlisis de deteccin del virus? El mdico decidir si debe realizarse un anlisis en funcin de sus sntomas, antecedentes de exposicin y factores de [place]. Cmo realiza el mdico el anlisis para detectar este virus? Los mdicos obtienen muestras para enviar a Chiropractor. Estas muestras pueden incluir lo siguiente:  Tomar con un hisopo lquido de Architectural technologist.  Pedirle que tosa mucosidad (esputo) para extraer lquido de los pulmones en un recipiente estril.  Tomar una muestra de [place].  Tomar una [place] de heces u [place]. Hay algn tratamiento o vacuna para este virus? Actualmente, no existe ninguna  vacuna para prevenir la enfermedad por coronavirus. Adems, no existen [employer] Palmolive antibiticos o los antivirales para tratar el virus. Una persona que se enferma recibe tratamiento de apoyo, lo que significa reposo y lquidos. Una persona tambin puede aliviar sus sntomas con medicamentos de venta libre para tratar los estornudos, la tos y el goteo nasal. Son los mismos medicamentos que se toman para el resfro comn. Si presenta sntomas, llame al mdico. Las personas con sntomas graves pueden necesitar atencin hospitalaria. Qu puedo hacer para protegerme y proteger a mi familia de este virus?     Puede protegerse y proteger a su familia tomando las mismas medidas que tomara para prevenir el contagio de otros virus. Johnson & Johnson las siguientes medidas:  Lavarse las manos regularmente con agua y [place] durante al Lowe's Companies  20segundos. Usar desinfectante para manos con alcohol si no dispone de [place] y [place].  Evitar tocarse la cara, la boca, la nariz y los ojos.  Toser o estornudar en un pauelo descartable, sobre su manga o codo. No toser o estornudar al aire ni cubrirse con la [place]. ? Si tose o estornuda en un pauelo de papel, deschelo inmediatamente y [employer].  Desinfectar los TEPPCO Partners y las superficies que se tocan con frecuencia todos [place].  Evitar el contacto cercano con personas que estn enfermas o que tengan fiebre o tos. Permanecer al menos a una distancia de 3 a 6 pies (1-39m) de las Nucor Corporation, si es posible.  Lennie Hummer en su casa si est enfermo, excepto para obtener atencin mdica. Llame al mdico antes de buscar atencin mdica.  Asegrese de [employer]Star las vacunas al da. Pregntele al mdico qu vacunas necesita. Qu debo hacer si tengo que viajar? Siga las recomendaciones relacionadas con los viajes de la autoridad de Psychiatrist, los CDC y Engineer, civil (consulting). Informacin y consejos para Nurse, adult for Disease Control and Prevention Insurance claims handler) (Centros para el  Control y la Prevencin de Event organiser): [email or web address].gl  Organizacin Mundial de Radiographer, therapeutic (OMS): [email or web address].se Fisher Scientific riesgos y tome medidas para proteger su salud  El riesgo de Primary school teacher la enfermedad por coronavirus es ms alto si viaja a zonas con un brote o si est en contacto con viajeros que provienen de zonas donde hay un brote.  Lvese las manos con frecuencia y [place] higiene [place] para reducir el riesgo de contagiarse o transmitir el virus. Qu debo hacer si estoy enfermo? Instrucciones generales para detener la propagacin de la infeccin  Lavarse las manos regularmente con agua y jabn durante al menos 20segundos. Usar desinfectante para manos con alcohol si no dispone de [place] y [place].  Toser o estornudar en un pauelo descartable, sobre su manga o codo. No toser o estornudar al aire ni cubrirse con la [place].  Si tose o estornuda en un pauelo de papel, deschelo inmediatamente y Verizon.  Lanny Hurst en su casa a menos que deba recibir Computer Sciences Corporation. Llame al mdico o a la autoridad de salud local antes de buscar atencin mdica.  Evite las zonas pblicas. No viaje en transporte pblico, de ser posible.  Si puede, use un barbijo si debe salir de la casa o si est en contacto cercano con alguien que no est enfermo. Mantenga su casa limpia  Desinfecte los objetos y las superficies que se tocan con frecuencia todos [place]. Pueden incluir: ? Encimeras y [place]. ? Picaportes e interruptores de luz. ? Lavabos, fregaderos y grifos. ? Aparatos electrnicos tales como telfonos, controles remotos, teclados, computadoras y tabletas.  Lave los platos con agua jabonosa caliente o en el lavavajillas. Deje los platos para que se sequen al aire.  Lave la ropa con agua caliente. Evite infectar a otros miembros de la familia  Permita que los miembros de la familia  sanos cuiden a los nios y las [place], si es posible. Si tiene que cuidar a los nios o las mascotas, lvese las manos con frecuencia y use un barbijo.  Duerma en una habitacin o cama diferentes, si es posible.  No comparta elementos personales, como afeitadoras, cepillos de dientes, desodorantes, peines, cepillos, toallas y [address] de [address]. Dnde buscar ms informacin Centers for Disease Control and Prevention (CDC)  Actualizaciones de informacin y novedades: CardRetirement.cz Organizacin Mundial de la Salud (OMS)  Actualizaciones de informacin y novedades:  AffordableSalon.es  Tema de salud relacionado con el coronavirus: [email or web address]-[email or web address]  Preguntas y Stryker Corporation COVID-19: kruiseway.com  Registro mundial: who.sprinklr.com American Academy of Pediatrics (AAP) (Academia Estadounidense de Pediatra)  Informacin para familias: www.healthychildren.org/English/health-issues/conditions/chest-lungs/Pages/2019-Novel-Coronavirus.aspx La situacin del coronavirus cambia rpidamente. Consulte el sitio web de su autoridad de Psychiatrist o los sitios web de los CDC y la OMS para enterarse de las novedades y noticias. Cundo debo comunicarme con un mdico?  Comunquese con su mdico si tiene sntomas de infeccin, como fiebre o tos, y: ? Arlean Hopping cerca de alguien que sabe que tiene la enfermedad por coronavirus. ? Arlean Hopping en contacto con una persona que presuntamente sufra de la enfermedad por coronavirus. ? Ha viajado fuera del pas. Cundo debo buscar asistencia mdica inmediata?  Busque ayuda de inmediato llamando al servicio de emergencias de su localidad (911 en los [place]) si tiene lo siguiente: ? Dificultad para respirar. ? Dolor u opresin en el pecho. ? Confusin. ? Labios y uas de [employer]. ? Dificultad para despertarse. ? Sntomas que  empeoran. Informe al personal mdico de emergencias si cree que tiene la enfermedad por coronavirus. Resumen  Un nuevo virus respiratorio se propaga de Neomia Dear persona a otra y causa COVID-19 (enfermedad por coronavirus).  El virus que causa el COVID-19 parece diseminarse fcilmente. Se transmite de [place] persona a otra a travs de las YUM! Brands se despiden al toser o al estornudar.  Los ONEOK y las personas que tienen enfermedades crnicas tienen mayor riesgo de Writer enfermedad. Si tiene un riesgo ms alto de tener complicaciones, tome Engineer, materials.  Actualmente, no existe ninguna vacuna para prevenir la enfermedad por coronavirus. No existen medicamentos, como los antibiticos o los antivirales, para tratar el virus.  Puede protegerse y proteger a su familia al lavarse las manos con frecuencia, evitar tocarse la cara y cubrirse al toser y Engineering geologist. Esta informacin no tiene Theme park manager el consejo del mdico. Asegrese de hacerle al mdico cualquier pregunta que tenga. Document Released: 02/24/2019 Document Revised: 02/24/2019 Document Reviewed: 02/24/2019 Elsevier Patient Education  2020 Elsevier Inc.    COVID-19: Cmo protegerse y proteger a los dems COVID-19: How to Protect Yourself and Others Sepa cmo se propaga  Actualmente, no existe ninguna vacuna para prevenir la enfermedad por coronavirus 2019 (COVID-19).  La mejor forma de prevenir la enfermedad es evitar exponerse a este virus.  Se cree que el virus se transmite principalmente de [place] persona a [place]. ? [employer] que estn en contacto directo entre s (a una distancia inferior a 6 pies [1.48m]). ? A travs de las gotitas respiratorias producidas cuando una persona infectada tose, estornuda o habla. ? Estas gotitas pueden caer en la boca o en la nariz de las personas que estn cerca o pueden ser [employer] pulmones. ? Algunos estudios recientes sugieren que la COVID-19  puede ser transmitida por personas que no presentan sntomas. Lo que todos deben hacer [employer] Enterprise Group las manos con frecuencia  Lvese las manos con frecuencia con agua y jabn durante al menos 20 segundos, especialmente despus de Oceanographer en un lugar pblico o despus de sonarse la nariz, toser o estornudar.  Si no dispone de [place] y [place], use un desinfectante de manos que contenga al menos un 60% de alcohol. Cubra todas las superficies de las manos y frtelas hasta que se sientan secas.  No se toque los ojos, la nariz y la boca sin antes lavarse las manos. Evite el contacto cercano  Qudese en casa si est enfermo.  Evite el contacto cercano con personas que estn enfermas.  Establezca distancia entre usted y [employer]. ? Recuerde que [employer] no tienen sntomas pueden transmitir el virus. ? Esto es Software engineer para las personas que tienen ms riesgo de [email or web address] Cbrase la boca y la nariz con un barbijo de tela cuando est cerca de otras personas  Puede transmitir la COVID-19 a Economist aunque no se sienta enfermo.  Todas las personas deben usar un barbijo de tela cuando tengan que ir a un lugar pblico, por ejemplo, al supermercado o a buscar otros productos necesarios. ? Los barbijos de tela no deben colocarse a nios menores de 2 aos de [place], a las personas que tienen problemas respiratorios o que estn inconscientes, incapacitadas o que por algn motivo no puedan quitarse la mascarilla sin [place].  El propsito del barbijo de tela es proteger a Economist en caso de que usted est infectado.  NO utilice las Gann[employer] Co a los trabajadores de [employer].  Contine manteniendo una distancia aproximada de 6 pies (1.40m) entre usted y Nucor Corporation. El barbijo de tela no reemplaza el distanciamiento social. Susa Loffler al toser y estornudar  Si est en  un ambiente privado y no tiene el barbijo de tela, recuerde siempre cubrirse la boca y la nariz con un pauelo descartable al toser o Engineering geologist, o usar el pliegue del codo.  Deseche los pauelos descartables usados en la basura.  Inmediatamente, lvese las manos con agua y [place] durante al menos 20segundos. Si no dispone de agua y [place], [employer] con un desinfectante de manos que contenga al menos un 60% de alcohol. Limpie y desinfecte  Limpie Y desinfecte las superficies que se tocan con frecuencia todos los [address]  [address]. Esto incluye mesas, picaportes, interruptores de luz, encimeras, mangos, escritorios, telfonos, teclados, inodoros, grifos y lavabos. [email or web address]  Si las superficies estn sucias, lmpielas: Use detergente o jabn y agua antes de la desinfeccin.  Luego, use un desinfectante domstico. Puede consultar una lista de los desinfectantes domsticos registrados en Financial controller (EPA) (Agencia de Proteccin Ambiental) aqu. [email or web address] 03/04/2019 Esta informacin no tiene Theme park manager el consejo del mdico. Asegrese de hacerle al mdico cualquier pregunta que tenga. Document Released: 02/20/2019 Document Revised: 03/18/2019 Document Reviewed: 02/11/2019 Elsevier Patient Education  2020 Elsevier Inc.   Apixaban oral tablets Qu es este medicamento? El APIXABAN es un anticoagulante (diluyente de la [place]). Se utiliza para reducir la posibilidad de derrame cerebral en los pacientes con una afeccin mdica llamada fibrilacin auricular. Se utiliza tambin para tratar o prevenir cogulos sanguneos en los pulmones o las venas. Este medicamento puede ser utilizado para otros usos; si tiene alguna pregunta consulte con su proveedor de atencin mdica o con su farmacutico. MARCAS COMUNES: Eliquis Qu le debo informar a mi profesional de la salud antes de tomar este  medicamento? Necesitan saber si usted presenta alguno de los siguientes problemas o situaciones: sndrome de anticuerpo antifosfolpido trastornos de sangrado hemorragia cerebral sangre en las heces (heces negras o de aspecto alquitranado) o si hay sangre en su vmito antecedentes de cogulos sanguneos antecedentes de hemorragia estomacal enfermedad renal enfermedad heptica vlvula cardiaca mecnica una reaccin alrgica o inusual al apixabn, a otros medicamentos, alimentos, colorantes o conservantes si est embarazada o buscando quedar embarazada si est amamantando a un beb Cmo debo utilizar este medicamento? Tome este medicamento por va oral con un vaso de agua. Siga las instrucciones de  la etiqueta del medicamento. Puede tomarlo con o sin alimentos. Si el Social worker, tmelo con alimentos. Tome su medicamento a intervalos regulares. No lo tome con una frecuencia mayor a la indicada. No deje de tomarlo, excepto si as lo indica su mdico. Dejar de tomar este medicamento podra aumentar su riesgo de tener un cogulo sanguneo. Asegrese de volver a surtir su receta antes de quedarse sin medicamento. Hable con su pediatra para informarse acerca del uso de este medicamento en nios. Puede requerir atencin especial. Sobredosis: Pngase en contacto inmediatamente con un centro toxicolgico o una sala de urgencia si usted cree que haya tomado demasiado medicamento. ATENCIN: Reynolds American es solo para usted. No comparta este medicamento con nadie. Qu sucede si me olvido de una dosis? Si se olvida una dosis, tmela lo antes posible. Si es casi la hora de la prxima dosis, tome slo esa dosis. No tome dosis adicionales o dobles. Qu puede interactuar con este medicamento? Esta medicina puede interactuar con los siguientes medicamentos: aspirina o medicamentos tipo aspirina ciertos medicamentos para infecciones micticas tales como quetoconazol e itraconazol ciertos  medicamentos para convulsiones tales como carbamazepina y fenitona ciertos medicamentos que tratan o previenen cogulos sanguneos, como warfarina, enoxaparina y dalteparina claritromicina los [place], medicamentos para el dolor o inflamacin, como ibuprofeno o naproxeno rifampicina ritonavir hierba de [place] Puede ser que esta lista no menciona todas las posibles interacciones. Informe a su profesional de [employer] de In[employer] Inc productos a base de hierbas, medicamentos de [place] o suplementos nutritivos que est tomando. Si usted fuma, consume bebidas alcohlicas o si utiliza drogas ilegales, indqueselo tambin a su profesional de [employer]. Algunas sustancias pueden interactuar con su medicamento. A qu debo estar atento al usar PPL Corporation? Visite a su profesional de la salud para que revise regularmente su evolucin. Usted podra necesitar realizarse ARAMARK Corporation de sangre mientras est usando [place]. Se supervisar su estado de salud atentamente mientras reciba este medicamento. Es importante no faltar a ninguna cita. Evite los deportes y actividades que podran causar una lesin mientras est usando este medicamento. Las lesiones o cadas graves pueden causar un sangrado oculto. Tenga cuidado al usar herramientas filosas o cuchillos. Considere usar Therapist, art. Tenga especial cuidado al [employer] o usar hilo dental. Informe cualquier lesin, hematoma, o puntos rojos en la piel a su profesional de [employer]. Si va a someterse a una operacin o a otro procedimiento, informe a su profesional de la salud que est tomando PPL Corporation. Use un brazalete o una cadena de identificacin mdica. Lleve con usted una tarjeta que describa su enfermedad, detalles de su medicamento y horario de dosis. Qu efectos secundarios puedo tener al Boston Scientific este medicamento? Efectos secundarios que debe informar a su mdico o a Producer, television/film/video de la salud tan pronto como sea  posible: Therapist, art, como erupcin cutnea, comezn/picazn o urticaria, e hinchazn de la cara, los labios o la lengua signos y sntomas de Landscape architect, tales como heces con sangre o de color negro y aspecto alquitranado; [place] de color rojo o marrn oscuro; escupir sangre o material marrn que tiene el aspecto de granos de caf molido; Regulatory affairs officer rojas en la piel; sangrado o moretones inusuales en los ojos, las encas o la nariz signos y sntomas de un cogulo sanguneo, tales como dolor en el pecho; falta de aire; dolor, hinchazn o calor en la pierna signos y sntomas de un accidente cerebrovascular, tales como cambios en la visin;  confusin; dificultad para hablar o entender; dolores de cabeza severos; entumecimiento o debilidad repentina de la cara, el brazo o la pierna; problemas al caminar; Psychiatrist; prdida de la coordinacin Puede ser que esta lista no menciona todos los posibles efectos secundarios. Comunquese a su mdico por asesoramiento mdico Hewlett[employer]ard. Usted puede informar los efectos secundarios a la FDA por telfono al 1-800-FDA-1088. Dnde debo guardar mi medicina? Mantngala fuera del alcance de los nios. Gurdela a Sanmina[employer]SCI, entre 20 y 25 grados C (48 y 35 grados F). Deseche todo el medicamento que no haya utilizado, despus de la fecha de vencimiento. ATENCIN: Este folleto es un resumen. Puede ser que no cubra toda la posible informacin. Si usted tiene preguntas acerca de esta medicina, consulte con su mdico, su farmacutico o su profesional de Radiographer, therapeutic.  2020 Elsevier/Gold Standard (2018-09-23 00:00:00)

## 2019-10-20 NOTE — Progress Notes (Signed)
Albert Hammond  CBJ:[id number] DOB: 1991-09-09 DOA: 10/13/2019 PCP: Patient, No Pcp Per    Brief Narrative:  28 y.o.malewithno previous past medical history presented to the emergency department with worsening shortness of breath and cough. He tested positive for Covid 5 days prior to admission.  Noted to be saturating in the 60s on room air.  He was placed on high flow nasal cannula at 15 L/min.  He was given remdesivir and dexamethasone.  Transferred to [facility].  Significant Events: 12/14 Admit via [facility] ED 12/19 chest pain - acute resp worsening - TTE w/o R heart strain - empiric full anticoag 12/20 venous duplex + DVT in posterior tibial and peroneal veins and probable early DVT B popliteal veins   COVID-19 specific Treatment: Remdesivir 12/14 > 12/18 Actemra 12/15 Solu-Medrol 12/15 > 12/16 Decadron 12/14 + 12/17 >  Subjective: Much improved today.  Oxygen requirement markedly decreased.  No respiratory distress.  CBG improved.  Anxious to get home as soon as possible.  Assessment & Plan:   Acute Hypoxic Resp. Failure -COVID Pneumonia Continues to require high level oxygen support but respiratory status has stabilized greatly with patient in no distress -has completed courses of remdesivir and Actemra -continue steroid therapy for a full 10-day course  Recent Labs  Lab 10/15/19 0058 10/16/19 0055 10/17/19 0030 10/18/19 0221 10/19/19 0450 10/20/19 0452  DDIMER 3.28* 3.80* 6.03* 8.46* 8.12*  --   FERRITIN 960* 695* 609* 461* 416*  --   CRP 21.7* 11.1* 7.1* 4.3* 1.2*  --   ALT 35 30 27 29  41 122*  PROCALCITON  --  <0.10 <0.10  --   --   --     L LE DVT - clinically suspected Acute PE  CTa at admission did not show PE - high clinical suspicion for PE occurring acutely night of 12/18 -left lower extremity DVT now confirmed via venous duplex -empiric anticoagulation initiated 12/19 -transition to oral anticoagulation today  Newly diagnosed DM2 -  uncontrolled with severe hyperglycemia did not have a known history of diabetes - HbA1c is 15.6 -required many days of frequent insulin titration but CBG is now within target range  Transaminitis New finding today -possibly due to remdesivir versus Covid itself -recheck in a.m.  Morbid obesity Estimated body mass index is 40.36 kg/m as calculated from the following:   Height as of this encounter: 5\' 7"  (1.702 m).   Weight as of this encounter: 116.9 kg.   DVT prophylaxis: Lovenox full dose Code Status: FULL CODE Family Communication: Disposition Plan: Transfer to MedSurg bed  Consultants:  none  Antimicrobials:  None  Objective: Blood pressure (!) 94/54, pulse 89, temperature 98.2 F (36.8 C), temperature source Oral, resp. rate 18, height 5\' 7"  (1.702 m), weight 116.9 kg, SpO2 93 %.  Intake/Output Summary (Last 24 hours) at 10/20/2019 1706 Last data filed at 10/20/2019 1147 Gross per 24 hour  Intake 880 ml  Output 2050 ml  Net -1170 ml   Filed Weights   10/14/19 0125  Weight: 116.9 kg    Examination: General: No acute respiratory distress Lungs: Fine diffuse crackles with no wheezing Cardiovascular: RRR  Abdomen: Obese, soft, BS positive, no rebound Extremities: Trace B LE edema   CBC: Recent Labs  Lab 10/17/19 0030 10/18/19 0221 10/19/19 0450 10/20/19 0452  WBC 14.4* 11.3* 12.6* 12.0*  NEUTROABS 9.5* 7.7 8.1*  --   HGB 14.7 15.1 15.8 15.7  HCT 45.4 45.0 47.1 46.7  MCV 85.8 85.2  84.4 83.8  PLT 371 336 370 365   Basic Metabolic Panel: Recent Labs  Lab 10/15/19 0058 10/15/19 1725 10/18/19 0221 10/19/19 0450 10/20/19 0452  NA 137   < > 135 137 138  K 4.0   < > 3.6 3.7 3.7  CL 99   < > 102 101 101  CO2 23   < > 22 25 23   GLUCOSE 414*  --  137* 138* 75  BUN 25*   < > 16 16 18   CREATININE 0.75   < > 0.51* 0.59* 0.67  CALCIUM 8.9   < > 8.2* 9.1 8.8*  MG 2.2  --   --   --   --    < > = values in this interval not displayed.   GFR: Estimated  Creatinine Clearance: 168 mL/min (by C-G formula based on SCr of 0.67 mg/dL).  Liver Function Tests: Recent Labs  Lab 10/17/19 0030 10/18/19 0221 10/19/19 0450 10/20/19 0452  AST 35 36 39 91*  ALT 27 29 41 122*  ALKPHOS 135* 111 105 85  BILITOT 0.5 0.7 0.7 1.1  PROT 6.9 5.8* 6.3* 6.2*  ALBUMIN 2.8* 2.7* 2.9* 3.1*    HbA1C: Hgb A1c MFr Bld  Date/Time Value Ref Range Status  10/13/2019 09:50 PM 15.6 (H) 4.8 - 5.6 % Final    Comment:    (NOTE) Pre diabetes:          5.7%-6.4% Diabetes:              >6.4% Glycemic control for   <7.0% adults with diabetes   10/13/2019 05:51 PM 15.5 (H) 4.8 - 5.6 % Final    Comment:    (NOTE)         Prediabetes: 5.7 - 6.4         Diabetes: >6.4         Glycemic control for adults with diabetes: <7.0     CBG: Recent Labs  Lab 10/19/19 1633 10/19/19 2223 10/20/19 0738 10/20/19 1146 10/20/19 1618  GLUCAP 189* 176* 100* 204* 251*    Recent Results (from the past 240 hour(s))  Blood Culture (routine x 2)     Status: None   Collection Time: 10/13/19  2:59 PM   Specimen: BLOOD  Result Value Ref Range Status   Specimen Description BLOOD BLOOD RIGHT WRIST  Final   Special Requests   Final    BOTTLES DRAWN AEROBIC AND ANAEROBIC Blood Culture adequate volume   Culture   Final    NO GROWTH 5 DAYS Performed at [facility], [address] [place], [place] [address]    Report Status 10/18/2019 FINAL  Final  Blood Culture (routine x 2)     Status: None   Collection Time: 10/13/19  2:59 PM   Specimen: BLOOD  Result Value Ref Range Status   Specimen Description BLOOD LEFT ANTECUBITAL  Final   Special Requests   Final    BOTTLES DRAWN AEROBIC AND ANAEROBIC Blood Culture adequate volume   Culture   Final    NO GROWTH 5 DAYS Performed at [facility], [address] [place], [place] [address]    Report Status 10/18/2019 FINAL  Final     Scheduled Meds: . apixaban  10 mg Oral BID   Followed by  . [START ON  10/27/2019] apixaban  5 mg Oral BID  . vitamin C  500 mg Oral Daily  . dexamethasone (DECADRON) injection  6 mg Intravenous Q24H  . dextromethorphan-guaiFENesin  1 tablet Oral BID  . famotidine  20 mg Oral Daily  . insulin aspart  0-20 Units Subcutaneous TID WC  . insulin aspart  0-5 Units Subcutaneous QHS  . insulin aspart  14 Units Subcutaneous TID WC  . insulin detemir  36 Units Subcutaneous BID  . zinc sulfate  220 mg Oral Daily      LOS: 7 days   Lonia Blood, MD [facility] Office  [phone] Pager - Text Page per Amion  If 7PM-7AM, please contact night-coverage per Amion 10/20/2019, 5:06 PM

## 2019-10-20 NOTE — Progress Notes (Signed)
   10/20/19 1500  Family/Significant Other Communication  Family/Significant Other Update Called (no answer)

## 2019-10-20 NOTE — Progress Notes (Signed)
ANTICOAGULATION CONSULT NOTE  Pharmacy Consult:  Lovenox >> Eliquis Indication:  VTE treatment  No Known Allergies  Patient Measurements: Height: 5\' 7"  (170.2 cm) Weight: 257 lb 11.5 oz (116.9 kg) IBW/kg (Calculated) : 66.1 Heparin Dosing Weight: HEPARIN DW (KG): 92.9  Vital Signs: Temp: 98.1 F (36.7 C) (12/21 0801) Temp Source: Oral (12/21 0801) BP: 95/52 (12/21 0801) Pulse Rate: 89 (12/21 0801)  Labs: Recent Labs    10/18/19 0221 10/19/19 0450 10/20/19 0452  HGB 15.1 15.8 15.7  HCT 45.0 47.1 46.7  PLT 336 370 365  CREATININE 0.51* 0.59* 0.67    Estimated Creatinine Clearance: 168 mL/min (by C-G formula based on SCr of 0.67 mg/dL).   Assessment: 82 YOM with Covid-19 to transition from Lovenox to Eliquis for confirmed DVT and suspected PE.  Renal function is stable.  No bleeding reported.  Noted last Lovenox dose was today around 1100.   Goal of Therapy:  Appropriate anticoagulation Monitor platelets by anticoagulation protocol: Yes   Plan:  D/C Lovenox Eliquis 10mg  PO BID x 7 days (start tonight), then on 12/28 PM start 5mg  PO BID Pharmacy will sign off and follow peripherally.  Thank you for the consult!  Shaquina Gillham D. Mina Marble, PharmD, BCPS, [place] 10/20/2019, 11:36 AM

## 2019-10-21 LAB — CBC
HCT: 51.9 % (ref 39.0–52.0)
Hemoglobin: 17.3 g/dL — ABNORMAL HIGH (ref 13.0–17.0)
MCH: 28.3 pg (ref 26.0–34.0)
MCHC: 33.3 g/dL (ref 30.0–36.0)
MCV: 84.8 fL (ref 80.0–100.0)
Platelets: 460 10*3/uL — ABNORMAL HIGH (ref 150–400)
RBC: 6.12 MIL/uL — ABNORMAL HIGH (ref 4.22–5.81)
RDW: 12.3 % (ref 11.5–15.5)
WBC: 14.4 10*3/uL — ABNORMAL HIGH (ref 4.0–10.5)
nRBC: 0 % (ref 0.0–0.2)

## 2019-10-21 LAB — COMPREHENSIVE METABOLIC PANEL
ALT: 145 U/L — ABNORMAL HIGH (ref 0–44)
AST: 75 U/L — ABNORMAL HIGH (ref 15–41)
Albumin: 3.7 g/dL (ref 3.5–5.0)
Alkaline Phosphatase: 94 U/L (ref 38–126)
Anion gap: 14 (ref 5–15)
BUN: 19 mg/dL (ref 6–20)
CO2: 23 mmol/L (ref 22–32)
Calcium: 9.3 mg/dL (ref 8.9–10.3)
Chloride: 97 mmol/L — ABNORMAL LOW (ref 98–111)
Creatinine, Ser: 0.49 mg/dL — ABNORMAL LOW (ref 0.61–1.24)
GFR calc Af Amer: >60 mL/min (ref >60)
GFR calc non Af Amer: >60 mL/min (ref >60)
Glucose, Bld: 64 mg/dL — ABNORMAL LOW (ref 70–99)
Potassium: 4.1 mmol/L (ref 3.5–5.1)
Sodium: 134 mmol/L — ABNORMAL LOW (ref 135–145)
Total Bilirubin: 1.3 mg/dL — ABNORMAL HIGH (ref 0.3–1.2)
Total Protein: 7.1 g/dL (ref 6.5–8.1)

## 2019-10-21 LAB — GLUCOSE, CAPILLARY
Glucose-Capillary: 82 mg/dL (ref 70–99)
Glucose-Capillary: 177 mg/dL — ABNORMAL HIGH (ref 70–99)
Glucose-Capillary: 319 mg/dL — ABNORMAL HIGH (ref 70–99)

## 2019-10-21 LAB — MAGNESIUM: Magnesium: 2.2 mg/dL (ref 1.7–2.4)

## 2019-10-21 MED ORDER — INSULIN ASPART PROT & ASPART (70-30 MIX) 100 UNIT/ML ~~LOC~~ SUSP: 32.0000 [IU] | Freq: Two times a day (BID) | SUBCUTANEOUS | 11 refills | Status: AC

## 2019-10-21 MED ORDER — INSULIN ASPART PROT & ASPART (70-30 MIX) 100 UNIT/ML ~~LOC~~ SUSP
45.0000 [IU] | Freq: Two times a day (BID) | SUBCUTANEOUS | Status: DC
  Administered 2019-10-21 – 2019-10-22 (×3): 45 [IU] via SUBCUTANEOUS
  Filled 2019-10-21: qty 10

## 2019-10-21 MED ORDER — ELIQUIS DVT/PE STARTER PACK 5 MG PO TBPK: ORAL_TABLET | ORAL | 0 refills | Status: AC

## 2019-10-21 MED ORDER — INSULIN ASPART PROT & ASPART (70-30 MIX) 100 UNIT/ML ~~LOC~~ SUSP: 32.0000 [IU] | Freq: Two times a day (BID) | SUBCUTANEOUS | 11 refills | Status: DC

## 2019-10-21 MED ORDER — INSULIN STARTER KIT- PEN NEEDLES (SPANISH): 1.0000 | Freq: Once | 0 refills | Status: AC

## 2019-10-21 MED ORDER — INSULIN STARTER KIT- PEN NEEDLES (SPANISH)
1.0000 | Freq: Once | Status: AC
  Administered 2019-10-21: 1
  Filled 2019-10-21: qty 1

## 2019-10-21 NOTE — TOC Initial Note (Signed)
Transition of Care [facility]) - Initial/Assessment Note    Patient Details  Name: Albert Hammond MRN: [id number] Date of Birth: 1991/09/18  Transition of Care (TOC) CM/SW Contact:    Joaquin Courts, RN Phone Number: 10/21/2019, 12:10 PM  Clinical Narrative:                 CM spoke with patient regarding need for pcp and insulin. Patient set up for follow up at [facility] in [place]. Patient will need Relion 70/30 insulin at discharge available from [facility] for a cost of 42$ which the patient states he can afford.    Expected Discharge Plan: Home/Self Care Barriers to Discharge: Continued Medical Work up   Patient Goals and CMS Choice        Expected Discharge Plan and Services Expected Discharge Plan: Home/Self Care   Discharge Planning Services: CM Consult   Living arrangements for the past 2 months: Single Family Home                 DME Arranged: N/A DME Agency: NA       HH Arranged: NA HH Agency: NA        Prior Living Arrangements/Services Living arrangements for the past 2 months: Single Family Home Lives with:: Spouse, Minor Children Patient language and need for interpreter reviewed:: Yes Do you feel safe going back to the place where you live?: Yes      Need for Family Participation in Patient Care: Yes (Comment) Care giver support system in place?: Yes (comment)   Criminal Activity/Legal Involvement Pertinent to Current Situation/Hospitalization: No - Comment as needed  Activities of Daily Living Home Assistive Devices/Equipment: None ADL Screening (condition at time of admission) Patient's cognitive ability adequate to safely complete daily activities?: Yes Is the patient deaf or have difficulty hearing?: No Does the patient have difficulty seeing, even when wearing glasses/contacts?: No Does the patient have difficulty concentrating, remembering, or making decisions?: No Patient able to express need for assistance with  ADLs?: Yes Does the patient have difficulty dressing or bathing?: No Independently performs ADLs?: Yes (appropriate for developmental age) Does the patient have difficulty walking or climbing stairs?: No Weakness of Legs: None Weakness of Arms/Hands: None  Permission Sought/Granted                  Emotional Assessment Appearance:: Appears stated age Attitude/Demeanor/Rapport: Engaged Affect (typically observed): Accepting Orientation: : Oriented to Self, Oriented to Place, Oriented to  Time, Oriented to Situation   Psych Involvement: No (comment)  Admission diagnosis:  Pneumonia due to COVID-19 virus [U07.1, J12.89] Patient Active Problem List   Diagnosis Date Noted  . COVID-19 10/13/2019  . Pneumonia due to COVID-19 virus 10/13/2019  . Hyperglycemia   . Acute respiratory failure with hypoxia [facility])    PCP:  Patient, No Pcp Per Pharmacy:   [facility] [address] ([address]), [place]) [place] [address] Phone: [phone] Fax: [phone]     Social Determinants of Health (SDOH) Interventions    Readmission Risk Interventions No flowsheet data found.

## 2019-10-21 NOTE — Progress Notes (Signed)
Spoke w/ pt on phone with [employer] this afternoon.  Spoke with pt about new diagnosis.  Discussed A1C results with him and explained basic pathophysiology of DM Type 2, basic home care, basic diabetes diet nutrition principles, importance of checking CBGs and maintaining good CBG control to prevent long-term and short-term complications.  Reviewed signs and symptoms of hyperglycemia and hypoglycemia and how to treat hypoglycemia at home.  Also reviewed blood sugar goals and A1c goals for home.    Also discussed with pt that he will be going home on 70/30 Insulin.  Explained what 70/30 insulin is, how to take, when to take, importance of eating three meals a day when taking this insulin.  Pt stated he was given a bottle of the 70/30 insulin and wanted to know how long he had to take it.  Explained to pt that he will need to continue to take the insulin at home indefinitely and that he needs to follow up with the MD at the [facility] clinic for further diabetes care.  Explained to pt that he needs to check his CBGs at home and that the MD outside the hospital will determine how to adjust his insulin.  Also explained to pt that if he ever runs out of 70/30 insulin that he can purchase more at [facility] for $25 per vial without a Rx.  RNs to provide ongoing basic DM education at bedside with this patient.  Have ordered educational booklet, insulin starter kit.  RNs to work with pt this afternoon on self injecting insulin.     --Will follow patient during hospitalization--  Wyn Quaker RN, MSN, CDE Diabetes Coordinator Inpatient Glycemic Control Team Team Pager: [phone] (8a-5p)

## 2019-10-21 NOTE — Progress Notes (Signed)
SATURATION QUALIFICATIONS: (This note is used to comply with regulatory documentation for home oxygen)  Patient Saturations on Room Air at Rest = 92%  Patient Saturations on Room Air while Ambulating = 86%  Patient Saturations on 2 Liters of oxygen while Ambulating = 93%  Please briefly explain why patient needs home oxygen: Patient experiences significant change in O2 saturations when ambulating on RA

## 2019-10-21 NOTE — TOC Progression Note (Signed)
Transition of Care [facility]) - Progression Note    Patient Details  Name: Albert Hammond MRN: [id number] Date of Birth: [birth year]  [facility] CM/SW Contact  Joaquin Courts, RN Phone Number: 10/21/2019, 2:19 PM  Clinical Narrative: CM printed 30 day free trial offer card for patient. Called and spoke with charge RN to ensure patient is provided with card at dc and to ensure patient is reminded to follow up with [facility] so he can apply for assistance with the cost of his medications.       Expected Discharge Plan: Home/Self Care Barriers to Discharge: Continued Medical Work up  Expected Discharge Plan and Services Expected Discharge Plan: Home/Self Care   Discharge Planning Services: CM Consult   Living arrangements for the past 2 months: Single Family Home Expected Discharge Date: 10/21/19               DME Arranged: N/A DME Agency: NA       HH Arranged: NA HH Agency: NA         Social Determinants of Health (SDOH) Interventions    Readmission Risk Interventions No flowsheet data found.

## 2019-10-21 NOTE — Discharge Summary (Signed)
DISCHARGE SUMMARY  Albert Hammond  MR#: [id number]  DOB:August 08, 1991  Date of Admission: 10/13/2019 Date of Discharge: 10/21/2019  Attending Physician:Miah Boye Hennie Duos, MD  Patient's OEV:[id number], No Pcp Per  Consults: none  Disposition: discharge home   Date of Positive COVID Test: 10/08/2019  Date Quarantine Ends: 10/29/2019  COVID-19 specific Treatment: Remdesivir 12/14 > 12/18 Actemra 12/15 Solu-Medrol 12/15 > 12/16 Decadron 12/14 + 12/17 > 12/22  Follow-up Appts: Follow-up [place], [place] Follow up.   Specialty: General Practice Why: Rennie Natter cita telephonica 11/03/2019 at 1pm con Dr Gay Filler.  Contact information: [address] [place] [address] [phone]           Tests Needing Follow-up: -f/u of LFTs is suggested in 5-7 days  -assess CBG control - DM meds likely to require further adjustment  -assess clinical status after 66moof eliquis to determine if anticaog for PE can be stopped, or should be extended to 620moDischarge Diagnoses: Acute Hypoxic Resp. Failure COVID Pneumonia L LE DVT - clinically suspected Acute PE  Newly diagnosed DM2 - uncontrolled with severe hyperglycemia Transaminitis Morbid obesity  Initial presentation: [age].o.malewithno previous past medical historypresented to theemergency department with worsening shortness of breath and cough. Hetested positive for Covid 5 days prior to admission.Noted to be saturating in the 60s on room air. He was placed on high flow nasal cannula at 15 L/min. He was given remdesivir and dexamethasone. [facility]: [facility] 12/19 chest pain - acute resp worsening - TTE w/o R heart strain - empiric full anticoag 12/20 venous duplex + DVT in posterior tibial and peroneal veins and probable early DVT B popliteal veins  12/22 discharge home   Acute  Hypoxic Resp. Failure - COVID Pneumonia Was able to be weaned to 2L [place] prior to discharge - improvement was quite slow initially - has completed courses of remdesivir and Actemra - discontinued steroid therapy after a full 8-day course - being tx for suspected PE  L LE DVT - clinically suspected Acute PE  CTa at admission did not show PE - high clinical suspicion for PE occurring acutely night of 12/18 - left lower extremity DVT confirmed via venous duplex, w/ pt experiencing the acute onset of pleuritic chest pain and desaturations - empiric anticoagulation initiated 12/19 - transitioned to oral anticoagulation prior to discharge - will need to complete a minimum of 3 months of tx, w/ reassessment to determine if extension to 6 months needed   Newly diagnosed DM2 - uncontrolled with severe hyperglycemia did not have a known history of diabetes - HbA1c is 15.6 - required many days of frequent insulin titration but CBG is now within target range at time of discharge - transitioned to 70/30 at time of discharge - educated on need to watch CBG at home to monitor for hypoglycemia and hyperglycemia - dose decreased at time of discharge as decadron being stopped at time of discharge home   Transaminitis New finding 12/21 - possibly due to remdesivir versus Covid itself - f/u LFTs will be needed as an outpt   Morbid obesity Estimated body mass index is 40.36 kg/m as calculated from the following: Height as of this encounter: _0  (1.702 m). Weight as of this encounter: 116.9 kg.    Allergies as of 10/21/2019   No Known Allergies     Medication List    STOP taking these  medications   dexamethasone 10 MG/ML injection Commonly known as: DECADRON   enoxaparin 40 MG/0.4ML injection Commonly known as: LOVENOX   famotidine 20 MG tablet Commonly known as: PEPCID   insulin aspart 100 UNIT/ML injection Commonly known as: novoLOG   Ipratropium-Albuterol 20-100 MCG/ACT Aers  respimat Commonly known as: COMBIVENT   sodium chloride 0.9 % infusion     TAKE these medications   acetaminophen 325 MG tablet Commonly known as: TYLENOL Take 650 mg by mouth every 6 (six) hours as needed.   ascorbic acid 500 MG tablet Commonly known as: VITAMIN C Take 1 tablet (500 mg total) by mouth daily.   cholecalciferol 25 MCG (1000 UT) tablet Commonly known as: VITAMIN D Take 1 tablet (1,000 Units total) by mouth daily.   Eliquis DVT/PE Starter Pack 5 MG Tbpk Generic drug: Apixaban Starter Pack Take as directed on package: start with two-5m tablets twice daily for 7 days. On day 8, switch to one-571mtablet twice daily.   insulin aspart protamine- aspart (70-30) 100 UNIT/ML injection Commonly known as: NOVOLOG MIX 70/30 Inject 0.32 mLs (32 Units total) into the skin 2 (two) times daily with a meal.   insulin starter kit- pen needles Misc 1 kit by Other route once for 1 dose.   ondansetron 4 MG tablet Commonly known as: ZOFRAN Take 1 tablet (4 mg total) by mouth every 6 (six) hours as needed for nausea. What changed: Another medication with the same name was removed. Continue taking this medication, and follow the directions you see here.   zinc sulfate 220 (50 Zn) MG capsule Take 1 capsule (220 mg total) by mouth daily.            Durable Medical Equipment  (From admission, onward)         Start     Ordered   10/21/19 1146  For home use only DME oxygen  Once    Question Answer Comment  Length of Need 6 Months   Mode or (Route) Nasal cannula   Liters per Minute 2   Frequency Continuous (stationary and portable oxygen unit needed)   Oxygen conserving device Yes   Oxygen delivery system Gas      10/21/19 1145          Day of Discharge BP (!) 93/58 (BP Location: Right Arm)   Pulse 97   Temp 98.1 F (36.7 C) (Oral)   Resp 16   Ht _0  (1.702 m)   Wt 116.9 kg   SpO2 96%   BMI 40.36 kg/m   Physical Exam: General: No acute respiratory  distress Lungs: Clear to auscultation bilaterally without wheezes or crackles Cardiovascular: Regular rate and rhythm without murmur gallop or rub normal S1 and S2 Abdomen: Nontender, nondistended, soft, bowel sounds positive, no rebound, no ascites, no appreciable mass Extremities: No significant cyanosis, clubbing, or edema bilateral lower extremities  Basic Metabolic Panel: Recent Labs  Lab 10/15/19 0058 10/15/19 1725 10/17/19 0030 10/18/19 0221 10/19/19 0450 10/20/19 0452 10/21/19 0529  NA 137   < > 135 135 137 138 134*  K 4.0   < > 3.6 3.6 3.7 3.7 4.1  CL 99   < > 100 102 101 101 97*  CO2 23   < > _1 GLUCOSE 414*  --  201* 137* 138* 75 64*  BUN 25*   < > 25* _2 CREATININE 0.75   < > 0.64 0.51* 0.59* 0.67 0.49*  CALCIUM 8.9   < > 8.8* 8.2* 9.1 8.8* 9.3  MG 2.2  --   --   --   --   --  2.2   < > = values in this interval not displayed.    Liver Function Tests: Recent Labs  Lab 10/17/19 0030 10/18/19 0221 10/19/19 0450 10/20/19 0452 10/21/19 0529  AST 35 36 39 91* 75*  ALT 27 29 41 122* 145*  ALKPHOS 135* 111 105 85 94  BILITOT 0.5 0.7 0.7 1.1 1.3*  PROT 6.9 5.8* 6.3* 6.2* 7.1  ALBUMIN 2.8* 2.7* 2.9* 3.1* 3.7    CBC: Recent Labs  Lab 10/15/19 0058 10/16/19 0055 10/17/19 0030 10/18/19 0221 10/19/19 0450 10/20/19 0452 10/21/19 0529  WBC 10.7* 16.1* 14.4* 11.3* 12.6* 12.0* 14.4*  NEUTROABS 8.1* 12.7* 9.5* 7.7 8.1*  --   --   HGB 13.2 13.7 14.7 15.1 15.8 15.7 17.3*  HCT 41.1 41.9 45.4 45.0 47.1 46.7 51.9  MCV 86.5 85.3 85.8 85.2 84.4 83.8 84.8  PLT 356 399 371 336 370 365 460*    CBG: Recent Labs  Lab 10/20/19 1146 10/20/19 1618 10/20/19 2003 10/21/19 0739 10/21/19 1137  GLUCAP 204* 251* 301* 82 177*    Recent Results (from the past 240 hour(s))  Blood Culture (routine x 2)     Status: None   Collection Time: 10/13/19  2:59 PM   Specimen: BLOOD  Result Value Ref Range Status   Specimen Description BLOOD BLOOD RIGHT  WRIST  Final   Special Requests   Final    BOTTLES DRAWN AEROBIC AND ANAEROBIC Blood Culture adequate volume   Culture   Final    NO GROWTH 5 DAYS Performed at [facility], [address] [place] [address]    Report Status 10/18/2019 FINAL  Final  Blood Culture (routine x 2)     Status: None   Collection Time: 10/13/19  2:59 PM   Specimen: BLOOD  Result Value Ref Range Status   Specimen Description BLOOD LEFT ANTECUBITAL  Final   Special Requests   Final    BOTTLES DRAWN AEROBIC AND ANAEROBIC Blood Culture adequate volume   Culture   Final    NO GROWTH 5 DAYS Performed at [facility], [address], [place], [place] [address]    Report Status 10/18/2019 FINAL  Final     Time spent in discharge (includes decision making & examination of pt): 35 minutes  10/21/2019, 2:06 PM   Cherene Altes, MD [facility] Office  [phone]

## 2019-10-21 NOTE — Progress Notes (Addendum)
Inpatient Diabetes Program Recommendations  AACE/ADA: New Consensus Statement on Inpatient Glycemic Control (2015)  Target Ranges:  Prepandial:   less than 140 mg/dL      Peak postprandial:   less than 180 mg/dL (1-2 hours)      Critically ill patients:  140 - 180 mg/dL   Results for Albert Hammond, Albert Hammond (MRN [id number]) as of 10/21/2019 07:41  Ref. Range 10/20/2019 07:38 10/20/2019 11:46 10/20/2019 16:18 10/20/2019 20:03  Glucose-Capillary Latest Ref Range: 70 - 99 mg/dL 100 (H)  14 units NOVOLOG  204 (H)  21 units NOVOLOG +  36 units LEVEMIR  251 (H)  25 units NOVOLOG  301 (H)  4 units NOVOLOG +  36 units LEVEMIR    Results for Albert Hammond, Albert Hammond (MRN [id number]) as of 10/21/2019 07:41  Ref. Range 10/21/2019 07:39  Glucose-Capillary Latest Ref Range: 70 - 99 mg/dL 82   Results for Albert Hammond, Albert Hammond (MRN [id number]) as of 10/21/2019 07:41  Ref. Range 10/13/2019 21:50  Hemoglobin A1C Latest Ref Range: 4.8 - 5.6 % 15.6 (H)  (401 mg/dl)    New Diagnosis of Diabetes/ COVID 19  Current Insulin Orders: Levemir 36 units BID            Novolog Resistant Correction Scale/ SSI (0-20 units) TID AC + HS        Novolog 14 units TID with meals   Getting Decadron 6 mg Daily.  MD- Note afternoon CBGs markedly elevated yesterday.  CBG this AM only 82 mg/dl  Please consider the following:  1. Reduce Levemir slightly to 32 units BID (10% overall reduction)  2. Increase Novolog Meal Coverage to: Novolog 18 units TID with meals  3. Not sure if patient will be able to afford Levemir and Novolog at time of discharge??  No insurance listed.  Will place [facility] consult to see what kind of assistance patient can get--May be able to get help from the [place]??  Or, it may be better to convert pt to Relion 70/30 Insulin BID when he is ready to go home??  Pt can get 70/30 insulin form [facility] for $25 per vial.     --Will follow patient during  hospitalization--  Wyn Quaker RN, MSN, CDE Diabetes Coordinator Inpatient Glycemic Control Team Team Pager: [phone] (8a-5p)

## 2019-10-21 NOTE — Progress Notes (Signed)
Diabetic teaching completed with patient. Patient administered evening insulin injections and checked blood sugar. Reinforcement needed.  Patient will need transportation arrangement for discharge anticipated tomorrow.

## 2019-10-22 DIAGNOSIS — J1289 Other viral pneumonia: Secondary | ICD-10-CM

## 2019-10-22 DIAGNOSIS — U071 COVID-19: Principal | ICD-10-CM

## 2019-10-22 LAB — GLUCOSE, CAPILLARY
Glucose-Capillary: 268 mg/dL — ABNORMAL HIGH (ref 70–99)
Glucose-Capillary: 117 mg/dL — ABNORMAL HIGH (ref 70–99)
Glucose-Capillary: 211 mg/dL — ABNORMAL HIGH (ref 70–99)
Glucose-Capillary: 295 mg/dL — ABNORMAL HIGH (ref 70–99)

## 2019-10-22 NOTE — Progress Notes (Addendum)
[facility] here to pick up patient. Patient discharge instructions and Eliquis coupon given to [facility]. Patient given afternoon insulin prior to discharge. Patient instructed not to take another dose of insulin prior to bedtime dosage. Patient cell phone, diabetes teaching handbook, kit, and Novolog 70/30 with patient upon discharge.

## 2019-10-22 NOTE — Progress Notes (Signed)
10/22/19- 1339- [facility] called for transport home, unit secretary notified, msg sent via Chat to the bedside RN. Paperwork prepared for transport- per [employer] home 02 delivery is 10 min out from home at this time.

## 2019-10-22 NOTE — Progress Notes (Signed)
28 year old male found to have acute Covid infection plus left lower extremity DVT and likely PE with secondary hypoxia as well as newly diagnosed diabetes mellitus: Being discharged on insulin and Eliquis.  Initially plan for discharge on 12/22, but [facility] trying to get insulin starter pack and patient needed more diabetic education.  Plan is for discharge today.

## 2019-10-22 NOTE — TOC Transition Note (Signed)
Transition of Care [facility]) - CM/SW Discharge Note Marvetta Gibbons RN, BSN Transitions of Care Unit 4E- RN Case Manager ([place]) [phone]   Patient Details  Name: Albert Hammond MRN: [id number] Date of Birth: 1991-07-03  Transition of Care [facility]) CM/SW Contact:  Dawayne Patricia, RN Phone Number: 10/22/2019, 12:24 PM   Clinical Narrative:    Pt stable for transition home today, pt will need Eliquis- script sent to [facility] yesterday- however they did not have in stock and had to order- call made to [facility] this AM to confirm drug will be in stock for pt today- per pharmacy they expect drug to be delivered in around noon- pt should be able to pick up meds this afternoon- pharmacy closes at 7pm today. Pt has been provided printed coupon for 30 day free- bedside RN has confirmed she has coupon to give pt. Noted pt has order for home 02 placed on 12/22- pt still on 02 today- will need home 02 for transition home- per bedside RN pt will need transportation assist home- and has confirmed address with pt- Call made to Learta Codding with [employer] for home 02 needs- home equipment will need to be at the home prior to [place] transport. CM will print paperwork needed for [facility] and have it placed on shadow chart for transport.    Final next level of care: Home/Self Care Barriers to Discharge: Barriers Resolved           Discharge Placement  Hme              Patient to be transferred to facility by: [facility]      Discharge Plan and Services   Discharge Planning Services: CM Consult            DME Arranged: Oxygen DME Agency: [place] Date DME Agency Contacted: 10/22/19 Time DME Agency Contacted: 1128 Representative spoke with at DME Agency: Learta Codding HH Arranged: NA [place] Agency: NA        Social Determinants of Health ([place]) Interventions     Readmission Risk Interventions Readmission Risk Prevention Plan 10/22/2019   Transportation Screening Complete  PCP or Specialist Appt within 5-7 Days Complete  Home Care Screening Complete  Medication Review (RN CM) Complete

## 2019-10-23 ENCOUNTER — Encounter (INDEPENDENT_AMBULATORY_CARE_PROVIDER_SITE_OTHER): Payer: Self-pay

## 2019-10-24 ENCOUNTER — Encounter (INDEPENDENT_AMBULATORY_CARE_PROVIDER_SITE_OTHER): Payer: Self-pay

## 2019-10-25 ENCOUNTER — Ambulatory Visit (INDEPENDENT_AMBULATORY_CARE_PROVIDER_SITE_OTHER)
Admission: RE | Admit: 2019-10-25 | Discharge: 2019-10-25 | Disposition: A | Discharge status: Home / Self Care | Payer: HRSA Program | Source: Ambulatory Visit

## 2019-10-25 ENCOUNTER — Telehealth: Payer: Self-pay

## 2019-10-25 DIAGNOSIS — U071 COVID-19: Secondary | ICD-10-CM | POA: Diagnosis not present

## 2019-10-25 DIAGNOSIS — R05 Cough: Secondary | ICD-10-CM | POA: Diagnosis not present

## 2019-10-25 DIAGNOSIS — R059 Cough, unspecified: Secondary | ICD-10-CM

## 2019-10-25 MED ORDER — BENZONATATE 100 MG PO CAPS: 100.0000 mg | ORAL_CAPSULE | Freq: Three times a day (TID) | ORAL | 0 refills | Status: DC

## 2019-10-25 NOTE — Telephone Encounter (Signed)
Pt evaluated today via virtual visit for cough and was prescribed tessalon perels. Pt with h/o acute respiratory failure 10/13/19 and wears oxygen at home. Pt h/o pneumonia.  Noted BPA from 10/24/19.

## 2019-10-25 NOTE — ED Provider Notes (Signed)
[facility] CARE CENTER    Virtual Visit via Video Note:  Albert Hammond  initiated request for Telemedicine visit with [facility] team. I connected with Albert Hammond  on 10/25/2019 at 9:27 AM  for a synchronized telemedicine visit using a video enabled HIPPA compliant telemedicine application. I verified that I am speaking with Albert Hammond  using two identifiers. Albert Harding, PA-C  was physically located in a [facility]gent care site and Albert Hammond was located at a different location.   The limitations of evaluation and management by telemedicine as well as the availability of in-person appointments were discussed. Patient was informed that he  may incur a bill ( including co-pay) for this virtual visit encounter. Albert Hammond  expressed understanding and gave verbal consent to proceed with virtual visit.  [id number] 10/25/19 Arrival Time: 0915  CC: COUGH  SUBJECTIVE:  Albert Hammond is a 28 y.o. male who presents with dry cough for the past few weeks.  Diagnosed with COVID on 10/13/2019.  He was admitted to the hospital at that time for acute respiratory distress and PNA secondary to COVID.  Symptoms are made worse at night.  However, overall reports improvement in symptoms.  Currently on 2 L of O2 at home.  Denies fever, chills, fatigue, rhinorrhea, sore throat, SOB, wheezing, chest pain, nausea, vomiting, changes in bowel or bladder habits.    ROS: As per HPI.  All other pertinent ROS negative.     History reviewed. No pertinent past medical history. History reviewed. No pertinent surgical history. No Known Allergies No current facility-administered medications on file prior to encounter.   Current Outpatient Medications on File Prior to Encounter  Medication Sig Dispense Refill  . acetaminophen (TYLENOL) 325 MG tablet Take 650 mg by mouth every 6 (six) hours as needed.    Marland Kitchen Apixaban Starter  Pack (ELIQUIS DVT/PE STARTER PACK) 5 MG TBPK Take as directed on package: start with two-5mg  tablets twice daily for 7 days. On day 8, switch to one-5mg  tablet twice daily. 1 each 0  . ascorbic acid (VITAMIN C) 500 MG tablet Take 1 tablet (500 mg total) by mouth daily.    . cholecalciferol (VITAMIN D) 25 MCG (1000 UT) tablet Take 1 tablet (1,000 Units total) by mouth daily.    . insulin aspart protamine- aspart (NOVOLOG MIX 70/30) (70-30) 100 UNIT/ML injection Inject 0.32 mLs (32 Units total) into the skin 2 (two) times daily with a meal. 10 mL 11  . ondansetron (ZOFRAN) 4 MG tablet Take 1 tablet (4 mg total) by mouth every 6 (six) hours as needed for nausea. 20 tablet 0  . zinc sulfate 220 (50 Zn) MG capsule Take 1 capsule (220 mg total) by mouth daily.        OBJECTIVE:  There were no vitals filed for this visit.   General appearance: alert; no distress Eyes: EOMI grossly HENT: normocephalic; atraumatic Neck: supple with FROM Lungs: normal respiratory effort; speaking in full sentences without difficulty; [place] present (pt on 2 L of O2 at home, breathing comfortably) Extremities: moves extremities without difficulty Skin: No obvious rashes Neurologic: No facial asymmetries Psychological: alert and cooperative; normal mood and affect   ASSESSMENT & PLAN:  1. COVID-19 virus infection   2. Cough     Meds ordered this encounter  Medications  . benzonatate (TESSALON) 100 MG capsule    Sig: Take 1 capsule (100 mg total) by mouth every 8 (eight) hours.  Dispense:  30 capsule    Refill:  0    Order Specific Question:   Supervising Provider    Answer:   Raylene Everts [[id number]]   You should remain isolated in your home for 10 days from symptom onset AND greater than 72 hours after symptoms resolution (absence of fever without the use of fever-reducing medication and improvement in respiratory symptoms), whichever is longer Get plenty of rest and push fluids Tessalon perles sent  to pharmacy on file You may use OTC zyrtec for nasal congestion, runny nose, and/or sore throat You may use OTC flonase as needed for nasal congestion and runny nose Use medications daily for symptom relief Use OTC medications like ibuprofen or tylenol as needed fever or pain Call or go to the ED if you have any new or worsening symptoms such as fever, cough, shortness of breath, chest tightness, chest pain, turning blue, changes in mental status, etc...   I discussed the assessment and treatment plan with the patient. The patient was provided an opportunity to ask questions and all were answered. The patient agreed with the plan and demonstrated an understanding of the instructions.   The patient was advised to call back or seek an in-person evaluation if the symptoms worsen or if the condition fails to improve as anticipated.  I provided 8 minutes of non-face-to-face time during this encounter.  Lestine Box, PA-C  10/25/2019 9:27 AM           Lestine Box, PA-C 10/25/19 [phone]

## 2019-10-25 NOTE — Discharge Instructions (Addendum)
You should remain isolated in your home for 10 days from symptom onset AND greater than 72 hours after symptoms resolution (absence of fever without the use of fever-reducing medication and improvement in respiratory symptoms), whichever is longer Get plenty of rest and push fluids Tessalon perles sent to pharmacy on file You may use OTC zyrtec for nasal congestion, runny nose, and/or sore throat You may use OTC flonase as needed for nasal congestion and runny nose Use medications daily for symptom relief Use OTC medications like ibuprofen or tylenol as needed fever or pain Call or go to the ED if you have any new or worsening symptoms such as fever, cough, shortness of breath, chest tightness, chest pain, turning blue, changes in mental status, etc..Marland Kitchen

## 2019-10-26 ENCOUNTER — Encounter (INDEPENDENT_AMBULATORY_CARE_PROVIDER_SITE_OTHER): Payer: Self-pay

## 2019-10-27 ENCOUNTER — Encounter (INDEPENDENT_AMBULATORY_CARE_PROVIDER_SITE_OTHER): Payer: Self-pay

## 2019-10-29 ENCOUNTER — Encounter (INDEPENDENT_AMBULATORY_CARE_PROVIDER_SITE_OTHER): Payer: Self-pay

## 2019-10-31 ENCOUNTER — Encounter (INDEPENDENT_AMBULATORY_CARE_PROVIDER_SITE_OTHER): Payer: Self-pay

## 2019-11-01 ENCOUNTER — Encounter (INDEPENDENT_AMBULATORY_CARE_PROVIDER_SITE_OTHER): Payer: Self-pay

## 2019-11-02 ENCOUNTER — Encounter (INDEPENDENT_AMBULATORY_CARE_PROVIDER_SITE_OTHER): Payer: Self-pay

## 2019-11-03 ENCOUNTER — Encounter (INDEPENDENT_AMBULATORY_CARE_PROVIDER_SITE_OTHER): Payer: Self-pay

## 2019-11-04 ENCOUNTER — Encounter (INDEPENDENT_AMBULATORY_CARE_PROVIDER_SITE_OTHER): Payer: Self-pay

## 2019-11-07 ENCOUNTER — Other Ambulatory Visit: Payer: Self-pay

## 2019-11-07 ENCOUNTER — Encounter: Payer: Self-pay | Admitting: Emergency Medicine

## 2019-11-07 ENCOUNTER — Ambulatory Visit
Admission: EM | Admit: 2019-11-07 | Discharge: 2019-11-07 | Disposition: A | Discharge status: Home / Self Care | Payer: Self-pay | Attending: Emergency Medicine | Admitting: Emergency Medicine

## 2019-11-07 ENCOUNTER — Ambulatory Visit (INDEPENDENT_AMBULATORY_CARE_PROVIDER_SITE_OTHER)
Admission: RE | Admit: 2019-11-07 | Discharge: 2019-11-07 | Disposition: A | Discharge status: Home / Self Care | Payer: Self-pay | Source: Ambulatory Visit

## 2019-11-07 DIAGNOSIS — R05 Cough: Secondary | ICD-10-CM

## 2019-11-07 DIAGNOSIS — R0789 Other chest pain: Secondary | ICD-10-CM

## 2019-11-07 DIAGNOSIS — R0602 Shortness of breath: Secondary | ICD-10-CM

## 2019-11-07 DIAGNOSIS — Z8616 Personal history of COVID-19: Secondary | ICD-10-CM

## 2019-11-07 DIAGNOSIS — R059 Cough, unspecified: Secondary | ICD-10-CM

## 2019-11-07 MED ORDER — BENZONATATE 100 MG PO CAPS: 100.0000 mg | ORAL_CAPSULE | Freq: Three times a day (TID) | ORAL | 0 refills | Status: AC

## 2019-11-07 NOTE — Discharge Instructions (Signed)
I do recommend that you go to the [facility] urgent care to be rechecked and to receive further instruction before your appointment next week with your primary care provider.

## 2019-11-07 NOTE — ED Provider Notes (Signed)
Albert Hammond    CSN: [id number] Arrival date & time: 11/07/19  1326      History   Chief Complaint Chief Complaint  Patient presents with  . Cough  . Fatigue    HPI Albert Hammond is a 29 y.o. male.   Patient presents with ongoing fatigue, shortness of breath, nonproductive cough; his symptoms occur when lifting, exercising, or talking a lot; and improve with rest.  Patient hospitalized with COVID on 10/13/2019.  He states he was taking Tessalon Perles for his cough but needs a refill.  He denies fever, chills, dizziness, weakness, or other symptoms.  He has an appointment on 11/14/2019 establish care with a primary care provider.  The history is provided by the patient.    History reviewed. No pertinent past medical history.  Patient Active Problem List   Diagnosis Date Noted  . COVID-19 10/13/2019  . Pneumonia due to COVID-19 virus 10/13/2019  . Hyperglycemia   . Acute respiratory failure with hypoxia ([place])     History reviewed. No pertinent surgical history.     Home Medications    Prior to Admission medications   Medication Sig Start Date End Date Taking? Authorizing Provider  acetaminophen (TYLENOL) 325 MG tablet Take 650 mg by mouth every 6 (six) hours as needed.    [provider]  Apixaban Starter Pack (ELIQUIS DVT/PE STARTER PACK) 5 MG TBPK Take as directed on package: start with two-5mg  tablets twice daily for 7 days. On day 8, switch to one-5mg  tablet twice daily. 10/21/19   Cherene Altes, MD  ascorbic acid (VITAMIN C) 500 MG tablet Take 1 tablet (500 mg total) by mouth daily. 10/13/19   Lorella Nimrod, MD  benzonatate (TESSALON) 100 MG capsule Take 1 capsule (100 mg total) by mouth every 8 (eight) hours. 11/07/19   Sharion Balloon, NP  cholecalciferol (VITAMIN D) 25 MCG (1000 UT) tablet Take 1 tablet (1,000 Units total) by mouth daily. 10/13/19   Lorella Nimrod, MD  insulin aspart protamine- aspart (NOVOLOG MIX 70/30) (70-30) 100  UNIT/ML injection Inject 0.32 mLs (32 Units total) into the skin 2 (two) times daily with a meal. 10/21/19   Cherene Altes, MD  ondansetron (ZOFRAN) 4 MG tablet Take 1 tablet (4 mg total) by mouth every 6 (six) hours as needed for nausea. 10/13/19   Lorella Nimrod, MD  zinc sulfate 220 (50 Zn) MG capsule Take 1 capsule (220 mg total) by mouth daily. 10/13/19   Lorella Nimrod, MD    Family History History reviewed. No pertinent family history.  Social History Social History   Tobacco Use  . Smoking status: Never Smoker  . Smokeless tobacco: Never Used  Substance Use Topics  . Alcohol use: Yes    Comment: occasional  . Drug use: Never     Allergies   Patient has no known allergies.   Review of Systems Review of Systems  Constitutional: Positive for fatigue. Negative for chills and fever.  HENT: Negative for ear pain and sore throat.   Eyes: Negative for pain and visual disturbance.  Respiratory: Positive for cough and shortness of breath.   Cardiovascular: Negative for chest pain and palpitations.  Gastrointestinal: Negative for abdominal pain and vomiting.  Genitourinary: Negative for dysuria and hematuria.  Musculoskeletal: Negative for arthralgias and back pain.  Skin: Negative for color change and rash.  Neurological: Negative for seizures and syncope.  All other systems reviewed and are negative.    Physical Exam Triage Vital  Signs ED Triage Vitals [11/07/19 1337]  Enc Vitals Group     BP      Pulse      Resp      Temp      Temp src      SpO2      Weight 200 lb (90.7 kg)     Height      Head Circumference      Peak Flow      Pain Score      Pain Loc      Pain Edu?      Excl. in GC?    No data found.  Updated Vital Signs BP 116/82   Pulse 99   Temp 99.3 F (37.4 C)   Resp 18   Wt 200 lb (90.7 kg)   SpO2 96%   BMI 31.32 kg/m   Visual Acuity Right Eye Distance:   Left Eye Distance:   Bilateral Distance:    Right Eye Near:   Left Eye  Near:    Bilateral Near:     Physical Exam Vitals and nursing note reviewed.  Constitutional:      General: He is not in acute distress.    Appearance: He is well-developed. He is not ill-appearing.  HENT:     Head: Normocephalic and atraumatic.     Right Ear: Tympanic membrane normal.     Left Ear: Tympanic membrane normal.     Nose: Nose normal.     Mouth/Throat:     Mouth: Mucous membranes are moist.     Pharynx: Oropharynx is clear.  Eyes:     Conjunctiva/sclera: Conjunctivae normal.  Cardiovascular:     Rate and Rhythm: Normal rate and regular rhythm.     Heart sounds: No murmur.  Pulmonary:     Effort: Pulmonary effort is normal. No respiratory distress.     Breath sounds: Normal breath sounds. No wheezing or rhonchi.  Abdominal:     General: Bowel sounds are normal.     Palpations: Abdomen is soft.     Tenderness: There is no abdominal tenderness. There is no guarding or rebound.  Musculoskeletal:     Cervical back: Neck supple.  Skin:    General: Skin is warm and dry.     Findings: No rash.  Neurological:     General: No focal deficit present.     Mental Status: He is alert and oriented to person, place, and time.  Psychiatric:        Mood and Affect: Mood normal.        Behavior: Behavior normal.      UC Treatments / Results  Labs (all labs ordered are listed, but only abnormal results are displayed) Labs Reviewed - No data to display  EKG   Radiology No results found.  Procedures Procedures (including critical care time)  Medications Ordered in UC Medications - No data to display  Initial Impression / Assessment and Plan / UC Course  I have reviewed the triage vital signs and the nursing notes.  Pertinent labs & imaging results that were available during my care of the patient were reviewed by me and considered in my medical decision making (see chart for details).    Cough, shortness of breath.  Patient is well-appearing and his exam is  unremarkable.  Refill on [employer] given today.  Instructed patient to follow-up with his new PCP on 11/14/2019 as scheduled.  Instructed him to go to the ED if he has  acute shortness of breath or other concerning symptoms.  Patient agrees to plan of care.     Final Clinical Impressions(s) / UC Diagnoses   Final diagnoses:  Cough  Shortness of breath     Discharge Instructions     Take the Tessalon Perles as needed for your cough.    Follow-up with your new primary care provider on 11/14/2019 as scheduled.    Go to the emergency department if you have acute shortness of breath or other concerning symptoms.        ED Prescriptions    Medication Sig Dispense Auth. Provider   benzonatate (TESSALON) 100 MG capsule Take 1 capsule (100 mg total) by mouth every 8 (eight) hours. 30 capsule Mickie Bail, NP     PDMP not reviewed this encounter.   Mickie Bail, NP 11/07/19 1400

## 2019-11-07 NOTE — Discharge Instructions (Signed)
Take the [facility] as needed for your cough.    Follow-up with your new primary care provider on 11/14/2019 as scheduled.    Go to the emergency department if you have acute shortness of breath or other concerning symptoms.

## 2019-11-07 NOTE — ED Provider Notes (Signed)
Virtual Visit via Video Note:  Albert Hammond  initiated request for Telemedicine visit with [facility]gent [facility] team. I connected with Albert Hammond  on 11/07/2019 at 12:21 PM  for a synchronized telemedicine visit using a video enabled HIPPA compliant telemedicine application. I verified that I am speaking with Albert Hammond  using two identifiers. Georgetta Haber, NP  was physically located in a [facility] Urgent care site and Lydia Jarrius [place] was located at a different location.   The limitations of evaluation and management by telemedicine as well as the availability of in-person appointments were discussed. Patient was informed that he  may incur a bill ( including co-pay) for this virtual visit encounter. Albert Hammond  expressed understanding and gave verbal consent to proceed with virtual visit.     History of Present Illness:Albert Hammond  is a 29 y.o. male presents with complaints of cough. Chest pain with the cough, and shortness of breath. Patient was exercising this morning, doing push ups etc, which seemed to trigger and exacerbation of his symptoms. Ran out of tessalon perles which were helping. No current shortness of breath . He is wearing his home oxygen, he was placed on this after hospitalization with covid-19 in December. He states he has been using home O2 prn for shortness of breath. He has a video appointment with a PCP on 1/15 to establish with a PCP. Tessalon has been helpful with his cough. States he still feels shortness of breath and tired with activity or speaking too much. He was discharged from the hospital on 12/23.  Patient has multiple questions about his overall plan of care. He is still on eliquis but getting close to out of this. He is questioning if he needs to continue taking it once he is out. He will have enough to take before his appointment next week, however. He was recently started on his  insulin, twice a day. He does check his blood sugar at home. His fasting this morning 156. Yesterday's fasting was 126.   No past medical history on file.  No Known Allergies      Observations/Objective: Alert, oriented, non toxic in appearance. Clear coherent speech without difficulty. No increased work of breathing visualized.  O2 by nasal cannula in place.   Assessment and Plan: Patient with multiple questions about his plan of care s/p covid pneumonia and respiratory failure. Still with shortness of breath, cough and intermittent chest pain. Using O2 prn. New diabetic. Establishing care with PCP end of next week. Recommend he is seen in person to evaluate his vitals and respiratory status better. Patient verbalized understanding and agreeable to plan.     Follow Up Instructions:    I discussed the assessment and treatment plan with the patient. The patient was provided an opportunity to ask questions and all were answered. The patient agreed with the plan and demonstrated an understanding of the instructions.   The patient was advised to call back or seek an in-person evaluation if the symptoms worsen or if the condition fails to improve as anticipated.  I provided 15 minutes of non-face-to-face time during this encounter.    Georgetta Haber, NP  11/07/2019 12:21 PM          Georgetta Haber, NP 11/07/19 1225

## 2019-11-07 NOTE — ED Triage Notes (Signed)
Patient in office today c/o fatigue shortness of breath and cough while trying to exercise or talk  OTC: none

## 2020-03-14 IMAGING — DX DG CHEST 1V PORT
1 series · 1 of 1 positions shown · non-contrast
Comparison: Chest CT from 5 days ago

CLINICAL DATA: Acute respiratory distress.

EXAM:
PORTABLE CHEST 1 VIEW

[chest]
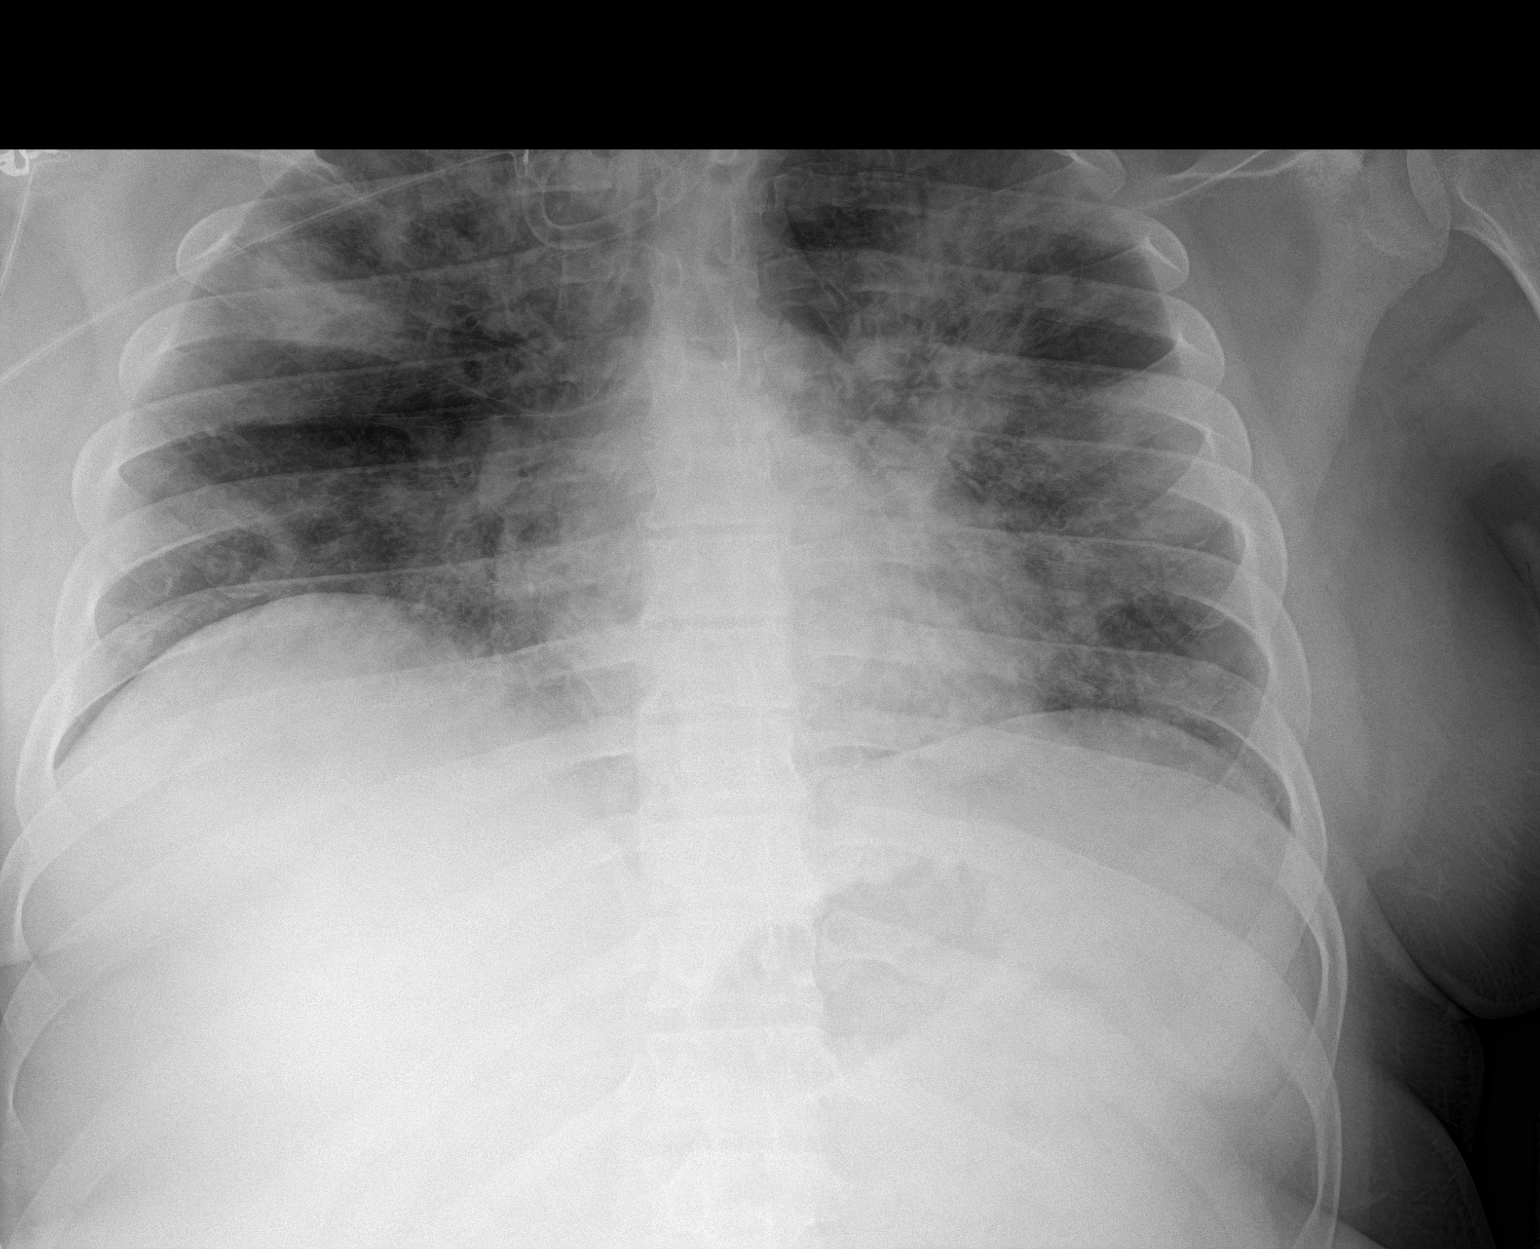

[1 of 1 positions shown; findings below may reference images not displayed]

FINDINGS: Bilateral pneumonia which is similar in extent. Lung volumes are
low. No edema, effusion, or pneumothorax. Normal heart size.
IMPRESSION: Unchanged extent of bilateral pneumonia.

## 2025-01-19 ENCOUNTER — Ambulatory Visit: Admitting: Family Medicine
